# Patient Record
Sex: Female | Born: 1975
Health system: Southern US, Community
[De-identification: ages and names within clinical notes are randomized; demographics above are authoritative.]

## PROBLEM LIST (undated history)

## (undated) DIAGNOSIS — Z8489 Family history of other specified conditions: Secondary | ICD-10-CM

## (undated) DIAGNOSIS — I341 Nonrheumatic mitral (valve) prolapse: Secondary | ICD-10-CM

## (undated) DIAGNOSIS — K219 Gastro-esophageal reflux disease without esophagitis: Secondary | ICD-10-CM

## (undated) DIAGNOSIS — J45909 Unspecified asthma, uncomplicated: Secondary | ICD-10-CM

## (undated) DIAGNOSIS — J189 Pneumonia, unspecified organism: Secondary | ICD-10-CM

## (undated) DIAGNOSIS — IMO0001 Reserved for inherently not codable concepts without codable children: Secondary | ICD-10-CM

## (undated) DIAGNOSIS — K5900 Constipation, unspecified: Secondary | ICD-10-CM

## (undated) DIAGNOSIS — Z8639 Personal history of other endocrine, nutritional and metabolic disease: Secondary | ICD-10-CM

## (undated) DIAGNOSIS — D649 Anemia, unspecified: Secondary | ICD-10-CM

## (undated) DIAGNOSIS — E119 Type 2 diabetes mellitus without complications: Secondary | ICD-10-CM

## (undated) DIAGNOSIS — R011 Cardiac murmur, unspecified: Secondary | ICD-10-CM

## (undated) HISTORY — DX: Unspecified asthma, uncomplicated: J45.909

## (undated) HISTORY — DX: Nonrheumatic mitral (valve) prolapse: I34.1

## (undated) HISTORY — DX: Personal history of other endocrine, nutritional and metabolic disease: Z86.39

## (undated) HISTORY — DX: Anemia, unspecified: D64.9

## (undated) HISTORY — PX: ABDOMINAL HYSTERECTOMY: SHX81

---

## 1997-06-18 ENCOUNTER — Other Ambulatory Visit: Admission: RE | Admit: 1997-06-18 | Discharge: 1997-06-18 | Payer: Self-pay | Admitting: Family Medicine

## 1997-07-26 ENCOUNTER — Other Ambulatory Visit: Admission: RE | Admit: 1997-07-26 | Discharge: 1997-07-26 | Payer: Self-pay | Admitting: Family Medicine

## 2000-01-20 ENCOUNTER — Emergency Department (HOSPITAL_COMMUNITY): Admission: EM | Admit: 2000-01-20 | Discharge: 2000-01-20 | Payer: Self-pay | Admitting: Emergency Medicine

## 2000-03-11 ENCOUNTER — Encounter: Payer: Self-pay | Admitting: Family Medicine

## 2000-03-11 ENCOUNTER — Encounter: Admission: RE | Admit: 2000-03-11 | Discharge: 2000-03-11 | Payer: Self-pay | Admitting: Family Medicine

## 2000-03-15 ENCOUNTER — Encounter: Admission: RE | Admit: 2000-03-15 | Discharge: 2000-03-15 | Payer: Self-pay | Admitting: Family Medicine

## 2000-03-15 ENCOUNTER — Encounter: Payer: Self-pay | Admitting: Family Medicine

## 2002-06-12 ENCOUNTER — Emergency Department (HOSPITAL_COMMUNITY): Admission: EM | Admit: 2002-06-12 | Discharge: 2002-06-13 | Payer: Self-pay | Admitting: Emergency Medicine

## 2002-06-13 ENCOUNTER — Encounter: Payer: Self-pay | Admitting: Emergency Medicine

## 2003-01-26 HISTORY — PX: TUBAL LIGATION: SHX77

## 2003-04-21 ENCOUNTER — Inpatient Hospital Stay (HOSPITAL_COMMUNITY): Admission: AD | Admit: 2003-04-21 | Discharge: 2003-04-23 | Payer: Self-pay | Admitting: Obstetrics and Gynecology

## 2003-04-22 ENCOUNTER — Encounter (INDEPENDENT_AMBULATORY_CARE_PROVIDER_SITE_OTHER): Payer: Self-pay | Admitting: Specialist

## 2004-06-06 ENCOUNTER — Emergency Department (HOSPITAL_COMMUNITY): Admission: EM | Admit: 2004-06-06 | Discharge: 2004-06-07 | Payer: Self-pay | Admitting: Emergency Medicine

## 2009-05-01 ENCOUNTER — Encounter: Admission: RE | Admit: 2009-05-01 | Discharge: 2009-05-01 | Payer: Self-pay | Admitting: Obstetrics and Gynecology

## 2010-02-14 ENCOUNTER — Encounter: Payer: Self-pay | Admitting: Emergency Medicine

## 2010-06-12 NOTE — H&P (Signed)
Christina Parker, Christina Parker                        ACCOUNT NO.:  [id number]   MEDICAL RECORD NO.:  [id number]                   PATIENT TYPE:  INP   LOCATION:  9162                                 [facility]ILITY:  [facility]   PHYSICIAN:  Everett Graff, M.D.                DATE OF BIRTH:  December 05, 1975   DATE OF ADMISSION:  04/21/2003  DATE OF DISCHARGE:                                HISTORY & PHYSICAL   Christina Parker is a 35 year old married black female, gravida 3, para 1-0-1-1,  at 37-2/7 weeks who presents with a gush of clear fluid at midnight. She  reports mild irregular uterine contractions since then with no bleeding, no  headache, no nausea or vomiting, and no visual disturbances. She reports  positive fetal movement. Her pregnancy has been followed by [facility]  OB/GYN MD service and has been remarkable for: (1) conception on the patch;  (2) group B strep negative.  Her prenatal labs were collected on October 24, 2002. Hemoglobin 12.4, hematocrit 37.8, platelet count 325,000. Blood  type AB positive, antibody negative, RPR nonreactive, rubella immune.  Hepatitis C surface antigen negative. HIV nonreactive. Pap smear within  normal limits. Gonorrhea negative. Chlamydia negative. Cystic fibrosis  negative.  Her one-hour Glucola from February 13, 2003, was within normal  limits.  Culture of the vaginal tract for group B strep on April 11, 2003,  were all negative.   HISTORY OF PRESENT PREGNANCY:  She presented for care at [facility] on  October 24, 2002, at [redacted] weeks gestation. She had an ultrasound at 13 weeks  because she was measuring size greater than dates; best [facility] based on that  ultrasound was May 10, 2003.  The second pregnancy ultrasonography at [redacted]  weeks gestation showed girth consistent with previous ultrasound.  At [redacted]  weeks gestation she was complaining of episodes of tachycardia for which she  had a 2-D echocardiogram, which was within normal limits. During the  pregnancy, the patient stated that she desired postpartum bilateral tubal  ligation. The rest of her prenatal care was unremarkable.   OBSTETRIC HISTORY:  She is a gravida 3, para 1-0-1-1. In March of 1996 at 8  weeks she had an SAB with no complications. In July 1997, she had a vaginal  delivery of female infant weighing 6 pounds 10 ounces at [redacted] weeks gestation  after 13 hours of labor. She had an epidural for anesthesia. The infant's  name was Christina Parker.  There were no complications with that pregnancy or birth.   PAST MEDICAL HISTORY:  She has no medication allergies. She reports  conceiving the pregnancy on Ortho-Evra. She reports having had the usual  childhood illnesses. Surgical history is remarkable for wisdom teeth  extraction.   FAMILY MEDICAL HISTORY:  Mother with history of bronchitis. Maternal  grandmother with insulin-dependent diabetes mellitus. Paternal grandfather  with lung cancer.   GENETIC HISTORY:  The patient has  a family history of twins.   SOCIAL HISTORY:  She is married to the father of the baby. His name is  Surveyor, mining.  He is involved and supportive. They are both college educated and  employed full time. They deny any alcohol, tobacco, or illicit drug use with  the pregnancy.   OBJECTIVE DATA:  VITAL SIGNS: Stable. She is afebrile.  HEENT: Grossly within normal limits.  CHEST: Clear to auscultation.  HEART: Regular rate and rhythm.  ABDOMEN: Gravid in contour with fundal height extending approximately 37 cm  above the pubic symphysis.  Fetal heart rate is reactive and reassuring.  Uterine contractions are irregular and mild every six to eight minutes.  Cervix is 1 cm, 50%, vertex, -3, with copious clear fluid present.  EXTREMITIES: Within normal limits.   ASSESSMENT:  1. Intrauterine pregnancy at term.  2. Premature range of motion with no labor.  3. Group B strep negative.   PLAN:  1. Admit to birthing suite and Dr. Mancel Bale has been notified.  2.  Routine MD orders.  3. Patient plans Pitocin induction.     Serita Grammes, C.N.M.                     Everett Graff, M.D.    KS/MEDQ  D:  04/21/2003  T:  04/21/2003  Job:  [id number]

## 2010-06-12 NOTE — Discharge Summary (Signed)
Christina Parker, Christina Parker                        ACCOUNT NO.:  [id number]   MEDICAL RECORD NO.:  [id number]                   PATIENT TYPE:  INP   LOCATION:  9106                                 [facility]   PHYSICIAN:  Eli Hose, M.D.            DATE OF BIRTH:  1975-02-14   DATE OF ADMISSION:  04/21/2003  DATE OF DISCHARGE:                                 DISCHARGE SUMMARY   ADMITTING DIAGNOSES:  1. Intrauterine pregnancy at term.  2. Premature rupture of membranes prior to the onset of labor.  3. Mild mitral valve prolapse.   DISCHARGE DIAGNOSES:  1. Intrauterine pregnancy at term.  2. Desired sterilization.   PROCEDURES:  1. Normal spontaneous vaginal birth.  2. Postpartum bilateral tubal ligation.  3. Epidural anesthesia.   HOSPITAL COURSE:  [name] is a 35 year old gravida 3 para 1-0-1-1 at 19  and two-sevenths weeks who presented with rupture of membranes on the  morning of April 21, 2003.  She was having mild irregular contractions at  that time.  Her pregnancy had been remarkable for:  1. Conception on the birth control patch.  2. Desires tubal sterilization.  3. Mild mitral valve prolapse.   On admission the patient was 1 cm, 50%, vertex at a -3, with clear fluid  noted.  Pitocin was begun after consult with Dr. Mancel Bale and at the  patient's preference.  An epidural was placed.  She was given antibiotics,  ampicillin and gentamycin, for mitral valve prolapse.  She progressed well  to delivery on April 21, 2003 at 1:19 p.m.  She had a viable female by the  name of Kelvin.  She had no lacerations noted.  She had some minor abrasions  on both labia.  The infant was taken to the full-term nursery.  Mother was  taken to the recovery phase in good condition.  She had consented for tubal  sterilization.  She was then taken the next morning to surgery for tubal  sterilization performed by Dr. Kendall Flack.  Hemoglobin that day was  9.4, down from 10.5.   White blood cell count 10.3, platelets were 250.  She  was doing well, she was up ad lib, tolerating a regular diet the day before.  Surgical procedure tubal sterilization was accomplished without difficulty.  Findings were normal tubes.  The patient then was taken to recovery in good  condition.  The rest of her hospital stay was uneventful.  By postpartum day  #2 and postoperative day #1 she was doing well, she was up ad lib, she was  tolerating a regular diet, she was using Motrin for pain, she was  breastfeeding and doing a little supplementation.  The infant was doing  well.  She was therefore deemed to have received the full benefit of her  hospital stay and was discharged home.   DISCHARGE INSTRUCTIONS:  Per [facility] handout.   DISCHARGE MEDICATIONS:  1. Motrin 600 mg p.o. q.6h. p.r.n. pain.  2. Tylox one to two p.o. q.3-4h. p.r.n. pain   DISCHARGE FOLLOW-UP:  Discharge follow-up will occur in 6 weeks at [facility].     Cathlean Marseilles Cira Servant, C.N.M.                   Eli Hose, M.D.    Marnee Spring  D:  04/23/2003  T:  04/23/2003  Job:  [id number]

## 2010-06-12 NOTE — Op Note (Signed)
[name], Christina Parker                        ACCOUNT NO.:  [id number]   MEDICAL RECORD NO.:  [id number]                   PATIENT TYPE:  INP   LOCATION:  9106                                 [facility]   PHYSICIAN:  Eldred Manges, M.D.             DATE OF BIRTH:  [birth year]   DATE OF PROCEDURE:  04/22/2003  DATE OF DISCHARGE:                                 OPERATIVE REPORT   PREOPERATIVE DIAGNOSIS:  Desire for surgical sterilization.   POSTOPERATIVE DIAGNOSIS:  Desire for surgical sterilization.   OPERATION:  Postpartum tubal sterilization.   SURGEON:  Eldred Manges, M.D.   ANESTHESIA:  Epidural.   ESTIMATED BLOOD LOSS:  Less than 10 mL.   COMPLICATIONS:  None.   FINDINGS:  Tubes appear normal for postpartum state.   DESCRIPTION OF PROCEDURE:  A discussion was held with the patient concerning  the indications for her procedure as well as the risks involved.  The  indication is her desire for her surgical sterilization.  The risks involved  include, but are not limited to, anesthesia, bleeding, infection, damage to  adjacent organs and failure of tubal sterilization resulting in subsequent  pregnancy, some of which are ectopic.  The patient seemed to understand and  wished to proceed.  She was taken to the operating room where her labor  epidural was dosed for surgical anesthesia.  She was placed on the  operating table in the supine position and the abdomen and perineum were  prepped with multiple layers of Betadine.  A red Robinson catheter was used  to empty the bladder.  The abdomen was draped as a sterile field.  Subumbilical injection of 5 mL of 0.25% Marcaine was undertaken.  A  subumbilical incision was made and the abdomen opened in layers to enter the  peritoneum.  The left fallopian tube was identified, followed to its  fimbriated end, then grasped at the isthmic portion and elevated.  A suture  of 2-0 chromic was placed through the mesosalpinx and  tied fore and aft on  the knuckle of tube.  A second ligature was placed proximal to that.  The  intervening knuckle of tube was excised and the cut ends cauterized.  A  similar procedure was carried out on the opposite side.  The hemostasis was  noted to be adequate.  The abdominal peritoneum was closed with a  pursestring suture of 0 Vicryl.  The fascia was closed with a running suture  of 0 Vicryl.  The skin incision was closed with a subcuticular suture of 3-0  Vicryl.  A sterile dressing was applied and the patient was taken from the  operating room to the recovery room in satisfactory condition having  tolerated the procedure well with sponge and instrument counts correct.   SPECIMENS TO PATHOLOGY:  Portions of right and left fallopian tube.  Eldred Manges, M.D.   VPH/MEDQ  D:  04/22/2003  T:  04/22/2003  Job:  [id number]

## 2013-03-30 ENCOUNTER — Ambulatory Visit (INDEPENDENT_AMBULATORY_CARE_PROVIDER_SITE_OTHER): Payer: Federal, State, Local not specified - PPO | Admitting: Family Medicine

## 2013-03-30 ENCOUNTER — Encounter: Payer: Self-pay | Admitting: Family Medicine

## 2013-03-30 VITALS — BP 110/80 | HR 78 | Temp 98.4°F | Resp 16 | Ht 63.0 in | Wt 216.0 lb

## 2013-03-30 DIAGNOSIS — Z0000 Encounter for general adult medical examination without abnormal findings: Secondary | ICD-10-CM

## 2013-03-30 DIAGNOSIS — Z131 Encounter for screening for diabetes mellitus: Secondary | ICD-10-CM

## 2013-03-30 DIAGNOSIS — Z13220 Encounter for screening for lipoid disorders: Secondary | ICD-10-CM

## 2013-03-30 LAB — LIPID PANEL
Cholesterol: 145 mg/dL (ref 0–200)
HDL: 37 mg/dL — ABNORMAL LOW (ref >39)
LDL Cholesterol: 94 mg/dL (ref 0–99)
Total CHOL/HDL Ratio: 3.9 ratio
Triglycerides: 70 mg/dL (ref <150)
VLDL: 14 mg/dL (ref 0–40)

## 2013-03-30 LAB — CBC
HCT: 37.5 % (ref 36.0–46.0)
Hemoglobin: 12.3 g/dL (ref 12.0–15.0)
MCH: 25.5 pg — ABNORMAL LOW (ref 26.0–34.0)
MCHC: 32.8 g/dL (ref 30.0–36.0)
MCV: 77.8 fL — ABNORMAL LOW (ref 78.0–100.0)
Platelets: 361 10*3/uL (ref 150–400)
RBC: 4.82 MIL/uL (ref 3.87–5.11)
RDW: 15.6 % — ABNORMAL HIGH (ref 11.5–15.5)
WBC: 6.6 10*3/uL (ref 4.0–10.5)

## 2013-03-30 LAB — COMPREHENSIVE METABOLIC PANEL WITH GFR
CO2: 24 meq/L (ref 19–32)
Calcium: 9.1 mg/dL (ref 8.4–10.5)
Creat: 0.72 mg/dL (ref 0.50–1.10)
Total Bilirubin: 0.4 mg/dL (ref 0.2–1.2)

## 2013-03-30 LAB — COMPREHENSIVE METABOLIC PANEL
ALT: 14 U/L (ref 0–35)
AST: 14 U/L (ref 0–37)
Albumin: 3.7 g/dL (ref 3.5–5.2)
Alkaline Phosphatase: 68 U/L (ref 39–117)
BUN: 12 mg/dL (ref 6–23)
Chloride: 106 mEq/L (ref 96–112)
Glucose, Bld: 95 mg/dL (ref 70–99)
Potassium: 4 mEq/L (ref 3.5–5.3)
Sodium: 141 mEq/L (ref 135–145)
Total Protein: 7.3 g/dL (ref 6.0–8.3)

## 2013-03-30 LAB — TSH: TSH: 1.327 u[IU]/mL (ref 0.350–4.500)

## 2013-03-30 LAB — POCT GLYCOSYLATED HEMOGLOBIN (HGB A1C): Hemoglobin A1C: 5.4

## 2013-03-30 LAB — VITAMIN B12: Vitamin B-12: 514 pg/mL (ref 211–911)

## 2013-03-30 NOTE — Progress Notes (Deleted)
   Subjective:    Patient ID: Christina Parker, female    DOB: 05/26/1975, 38 y.o.   MRN: [id number]  HPI    Review of Systems     Objective:   Physical Exam        Assessment & Plan:

## 2013-03-30 NOTE — Progress Notes (Signed)
Chief Complaint:  Chief Complaint  Patient presents with  . Annual Exam    HPI: Christina Parker is a 38 y.o. female who is here for  Annual Visit She is doing well, she has heavy cycles and will get advice from Dr Marlane Hatcher she sees her next She is seeing Dr Orvan Seen for her ob/gyn care. She is going to see her next week, on her menses right now, she  She is irregular, 6 months without menses Skipped Nov, Dec January, has had it in Feb LAsts 5 days, heavy, ful of clots LMP on Monday [id number] 35 and 38 year old childre Prior pap was years ago Menarche 11  Prior breast  Last pap was many years ago, no prior abnormal pap MVP dx some years ago, [facility]    Past Medical History  Diagnosis Date  . Anemia   . MVP (mitral valve prolapse)   . History of vitamin B deficiency   . History of vitamin D deficiency    History reviewed. No pertinent past surgical history. History   Social History  . Marital Status: Married    Spouse Name: N/A    Number of Children: N/A  . Years of Education: N/A   Social History Main Topics  . Smoking status: Never Smoker   . Smokeless tobacco: None  . Alcohol Use: No  . Drug Use: No  . Sexual Activity: None   Other Topics Concern  . None   Social History Narrative   Married   Manufacturing systems engineer   Family History  Problem Relation Age of Onset  . Diabetes Sister   . Diabetes Maternal Grandmother   . Hypertension Maternal Grandmother   . Hypertension Maternal Grandfather   . Diabetes Paternal Grandmother   . Hypertension Paternal Grandmother   . Diabetes Paternal Grandfather   . Hypertension Paternal Grandfather    No Known Allergies Prior to Admission medications   Not on File     ROS: The patient denies fevers, chills, night sweats, unintentional weight loss, chest pain, palpitations, wheezing, dyspnea on exertion, nausea, vomiting, abdominal pain, dysuria, hematuria, melena, numbness,  weakness, or tingling.   All other systems have been reviewed and were otherwise negative with the exception of those mentioned in the HPI and as above.    PHYSICAL EXAM: Filed Vitals:   03/30/13 0812  BP: 110/80  Pulse: 78  Temp: 98.4 F (36.9 C)  Resp: 16   Filed Vitals:   03/30/13 0812  Height: 5' 3"  (1.6 m)  Weight: 216 lb (97.977 kg)   Body mass index is 38.27 kg/(m^2).  General: Alert, no acute distress, pleasant HEENT:  Normocephalic, atraumatic, oropharynx patent. EOMI, PERRLA, no thyroidmegaly Cardiovascular:  Regular rate and rhythm, no rubs murmurs or gallops.  No Carotid bruits, radial pulse intact. No pedal edema.  Respiratory: Clear to auscultation bilaterally.  No wheezes, rales, or rhonchi.  No cyanosis, no use of accessory musculature GI: No organomegaly, abdomen is soft and non-tender, positive bowel sounds.  No masses. Skin: No rashes. Neurologic: Facial musculature symmetric. Psychiatric: Patient is appropriate throughout our interaction. Lymphatic: No cervical lymphadenopathy Musculoskeletal: Gait intact. 5/5 strength, 2/2 DTRs, full ROM   LABS: No results found for this or any previous visit.   EKG/XRAY:   Primary read interpreted by Dr. Marin Comment at [facility].   ASSESSMENT/PLAN: Encounter Diagnoses  Name Primary?  . Annual physical exam Yes  . Screening for  diabetes mellitus   . Screening for hyperlipidemia    Defer pelvic and pap to ob/gyn Labs pending She will sign a release to get [facility] for [facility] for [facility] Did not hear it onher today, she ahd this during pregnancy F/u in 1 year or prn  Gross sideeffects, risk and benefits, and alternatives of medications d/w patient. Patient is aware that all medications have potential sideeffects and we are unable to predict every sideeffect or drug-drug interaction that may occur.  LE, [place], DO 03/30/2013 8:45 AM

## 2013-03-31 LAB — VITAMIN D 25 HYDROXY (VIT D DEFICIENCY, FRACTURES): Vit D, 25-Hydroxy: 45 ng/mL (ref 30–89)

## 2013-04-13 ENCOUNTER — Encounter: Payer: Self-pay | Admitting: Family Medicine

## 2013-04-19 ENCOUNTER — Telehealth: Payer: Self-pay

## 2013-04-19 NOTE — Telephone Encounter (Signed)
Spoke to pt,  given lab values from 03/30/13.  A1c, hgb, hct

## 2013-04-19 NOTE — Telephone Encounter (Signed)
Lab call   [phone]  Work cell phone [phone]

## 2013-07-24 ENCOUNTER — Encounter (HOSPITAL_COMMUNITY): Payer: Self-pay | Admitting: *Deleted

## 2013-07-27 ENCOUNTER — Encounter (HOSPITAL_COMMUNITY): Payer: Self-pay | Admitting: Pharmacist

## 2013-08-01 MED ORDER — DEXTROSE 5 % IV SOLN
2.0000 g | INTRAVENOUS | Status: AC
  Administered 2013-08-02: 2 g via INTRAVENOUS
  Filled 2013-08-01: qty 2

## 2013-08-01 NOTE — H&P (Addendum)
38 yo G2P2 with menorrhagia, dysmenorrhea and irregular menses presents for surgical mngt.  Pt unable to tolerate OCPs because of severe nausea.   PMHx:  Neg PShx:;SVD x 2, BTL Shx:  Negative All:  None Meds:  Vitamins FHx:  N/c  AF, VSS Gen - NAD CV - RRR Lungs - clear Abd - soft, NT PV - uterus mobile, NT.  No adnexal tenderness/masses Unable to perform in office EmBx b/c of stenosis of internal cervical os  [place]:  Normal uterus without masses.  EMS 3.106m.  No adnexal masses. No  Free fluid  A/P:  Menorrhagia/Dysmenorrhea Hysteroscopy, D&C, endometrial ablation R/b/a & expected outcome discussed, questions answered, informed consent

## 2013-08-02 ENCOUNTER — Ambulatory Visit (HOSPITAL_COMMUNITY)
Admission: RE | Admit: 2013-08-02 | Discharge: 2013-08-02 | Disposition: A | Discharge status: Home / Self Care | Payer: Federal, State, Local not specified - PPO | Source: Ambulatory Visit | Attending: Obstetrics and Gynecology | Admitting: Obstetrics and Gynecology

## 2013-08-02 ENCOUNTER — Encounter (HOSPITAL_COMMUNITY): Payer: Federal, State, Local not specified - PPO | Admitting: Anesthesiology

## 2013-08-02 ENCOUNTER — Ambulatory Visit (HOSPITAL_COMMUNITY): Payer: Federal, State, Local not specified - PPO | Admitting: Anesthesiology

## 2013-08-02 ENCOUNTER — Encounter (HOSPITAL_COMMUNITY)
Admission: RE | Disposition: A | Discharge status: Home / Self Care | Payer: Self-pay | Source: Ambulatory Visit | Attending: Obstetrics and Gynecology

## 2013-08-02 ENCOUNTER — Encounter (HOSPITAL_COMMUNITY): Payer: Self-pay | Admitting: *Deleted

## 2013-08-02 DIAGNOSIS — D649 Anemia, unspecified: Secondary | ICD-10-CM | POA: Insufficient documentation

## 2013-08-02 DIAGNOSIS — N920 Excessive and frequent menstruation with regular cycle: Secondary | ICD-10-CM | POA: Insufficient documentation

## 2013-08-02 DIAGNOSIS — N926 Irregular menstruation, unspecified: Secondary | ICD-10-CM | POA: Insufficient documentation

## 2013-08-02 DIAGNOSIS — N946 Dysmenorrhea, unspecified: Secondary | ICD-10-CM | POA: Insufficient documentation

## 2013-08-02 DIAGNOSIS — I059 Rheumatic mitral valve disease, unspecified: Secondary | ICD-10-CM | POA: Insufficient documentation

## 2013-08-02 DIAGNOSIS — R011 Cardiac murmur, unspecified: Secondary | ICD-10-CM | POA: Insufficient documentation

## 2013-08-02 HISTORY — DX: Family history of other specified conditions: Z84.89

## 2013-08-02 HISTORY — PX: DILITATION & CURRETTAGE/HYSTROSCOPY WITH NOVASURE ABLATION: SHX5568

## 2013-08-02 LAB — CBC
HEMATOCRIT: 36.4 % (ref 36.0–46.0)
HEMOGLOBIN: 11.9 g/dL — AB (ref 12.0–15.0)
MCH: 26.2 pg (ref 26.0–34.0)
MCHC: 32.7 g/dL (ref 30.0–36.0)
MCV: 80 fL (ref 78.0–100.0)
Platelets: 302 10*3/uL (ref 150–400)
RBC: 4.55 MIL/uL (ref 3.87–5.11)
RDW: 15.3 % (ref 11.5–15.5)
WBC: 7.7 10*3/uL (ref 4.0–10.5)

## 2013-08-02 SURGERY — DILATATION & CURETTAGE/HYSTEROSCOPY WITH NOVASURE ABLATION
Anesthesia: General | Site: Vagina

## 2013-08-02 MED ORDER — OXYCODONE-ACETAMINOPHEN 5-325 MG PO TABS: 1.0000 | ORAL_TABLET | ORAL | Status: DC | PRN

## 2013-08-02 MED ORDER — ONDANSETRON HCL 4 MG/2ML IJ SOLN
INTRAMUSCULAR | Status: AC
  Filled 2013-08-02: qty 2

## 2013-08-02 MED ORDER — FENTANYL CITRATE 0.05 MG/ML IJ SOLN
INTRAMUSCULAR | Status: AC
  Administered 2013-08-02: 50 ug via INTRAVENOUS
  Filled 2013-08-02: qty 2

## 2013-08-02 MED ORDER — PROPOFOL 10 MG/ML IV EMUL
INTRAVENOUS | Status: AC
  Filled 2013-08-02: qty 20

## 2013-08-02 MED ORDER — MIDAZOLAM HCL 5 MG/5ML IJ SOLN
INTRAMUSCULAR | Status: DC | PRN
  Administered 2013-08-02: 2 mg via INTRAVENOUS

## 2013-08-02 MED ORDER — KETOROLAC TROMETHAMINE 30 MG/ML IJ SOLN: 15.0000 mg | Freq: Once | INTRAMUSCULAR | Status: DC | PRN

## 2013-08-02 MED ORDER — DEXAMETHASONE SODIUM PHOSPHATE 10 MG/ML IJ SOLN
INTRAMUSCULAR | Status: DC | PRN
  Administered 2013-08-02: 10 mg via INTRAVENOUS

## 2013-08-02 MED ORDER — LIDOCAINE HCL (CARDIAC) 20 MG/ML IV SOLN
INTRAVENOUS | Status: DC | PRN
  Administered 2013-08-02: 60 mg via INTRAVENOUS

## 2013-08-02 MED ORDER — PROPOFOL 10 MG/ML IV BOLUS
INTRAVENOUS | Status: DC | PRN
  Administered 2013-08-02: 200 mg via INTRAVENOUS

## 2013-08-02 MED ORDER — FENTANYL CITRATE 0.05 MG/ML IJ SOLN
25.0000 ug | INTRAMUSCULAR | Status: DC | PRN
  Administered 2013-08-02 (×2): 50 ug via INTRAVENOUS

## 2013-08-02 MED ORDER — METOCLOPRAMIDE HCL 5 MG/ML IJ SOLN
INTRAMUSCULAR | Status: AC
  Filled 2013-08-02: qty 2

## 2013-08-02 MED ORDER — IBUPROFEN 600 MG PO TABS: 600.0000 mg | ORAL_TABLET | Freq: Four times a day (QID) | ORAL | Status: DC | PRN

## 2013-08-02 MED ORDER — KETOROLAC TROMETHAMINE 30 MG/ML IJ SOLN
INTRAMUSCULAR | Status: AC
  Filled 2013-08-02: qty 1

## 2013-08-02 MED ORDER — MIDAZOLAM HCL 2 MG/2ML IJ SOLN
INTRAMUSCULAR | Status: AC
  Filled 2013-08-02: qty 2

## 2013-08-02 MED ORDER — DEXAMETHASONE SODIUM PHOSPHATE 10 MG/ML IJ SOLN
INTRAMUSCULAR | Status: AC
Start: 2013-08-02 — End: 2013-08-02
  Filled 2013-08-02: qty 1

## 2013-08-02 MED ORDER — METOCLOPRAMIDE HCL 5 MG/ML IJ SOLN
INTRAMUSCULAR | Status: AC
  Administered 2013-08-02: 10 mg via INTRAVENOUS
  Filled 2013-08-02: qty 2

## 2013-08-02 MED ORDER — METOCLOPRAMIDE HCL 5 MG/ML IJ SOLN
10.0000 mg | Freq: Once | INTRAMUSCULAR | Status: AC | PRN
  Administered 2013-08-02: 10 mg via INTRAVENOUS

## 2013-08-02 MED ORDER — LIDOCAINE HCL (CARDIAC) 20 MG/ML IV SOLN
INTRAVENOUS | Status: AC
  Filled 2013-08-02: qty 5

## 2013-08-02 MED ORDER — LIDOCAINE HCL 1 % IJ SOLN
INTRAMUSCULAR | Status: AC
  Filled 2013-08-02: qty 20

## 2013-08-02 MED ORDER — ONDANSETRON HCL 4 MG/2ML IJ SOLN
INTRAMUSCULAR | Status: DC | PRN
  Administered 2013-08-02: 4 mg via INTRAVENOUS

## 2013-08-02 MED ORDER — LIDOCAINE HCL 1 % IJ SOLN
INTRAMUSCULAR | Status: DC | PRN
  Administered 2013-08-02: 10 mL

## 2013-08-02 MED ORDER — FENTANYL CITRATE 0.05 MG/ML IJ SOLN
INTRAMUSCULAR | Status: AC
  Filled 2013-08-02: qty 2

## 2013-08-02 MED ORDER — MEPERIDINE HCL 25 MG/ML IJ SOLN: 6.2500 mg | INTRAMUSCULAR | Status: DC | PRN

## 2013-08-02 MED ORDER — LACTATED RINGERS IV SOLN
INTRAVENOUS | Status: DC
  Administered 2013-08-02 (×2): via INTRAVENOUS

## 2013-08-02 MED ORDER — FENTANYL CITRATE 0.05 MG/ML IJ SOLN
INTRAMUSCULAR | Status: DC | PRN
  Administered 2013-08-02 (×2): 50 ug via INTRAVENOUS

## 2013-08-02 MED ORDER — KETOROLAC TROMETHAMINE 30 MG/ML IJ SOLN
INTRAMUSCULAR | Status: DC | PRN
  Administered 2013-08-02: 30 mg via INTRAVENOUS

## 2013-08-02 SURGICAL SUPPLY — 16 items
ABLATOR ENDOMETRIAL BIPOLAR (ABLATOR) ×2 IMPLANT
CATH ROBINSON RED A/P 16FR (CATHETERS) ×2 IMPLANT
CLOTH BEACON ORANGE TIMEOUT ST (SAFETY) ×2 IMPLANT
CONTAINER PREFILL 10% NBF 60ML (FORM) ×1 IMPLANT
DILATOR CANAL MILEX (MISCELLANEOUS) ×1 IMPLANT
DRAPE HYSTEROSCOPY (DRAPE) ×2 IMPLANT
GLOVE BIO SURGEON STRL SZ 6.5 (GLOVE) ×2 IMPLANT
GLOVE BIOGEL PI IND STRL 7.0 (GLOVE) ×1 IMPLANT
GLOVE BIOGEL PI INDICATOR 7.0 (GLOVE) ×1
GOWN STRL REUS W/TWL LRG LVL3 (GOWN DISPOSABLE) ×4 IMPLANT
PACK VAGINAL MINOR WOMEN LF (CUSTOM PROCEDURE TRAY) ×2 IMPLANT
PAD OB MATERNITY 4.3X12.25 (PERSONAL CARE ITEMS) ×2 IMPLANT
SET TUBING HYSTEROSCOPY 2 NDL (TUBING) ×1 IMPLANT
TOWEL OR 17X24 6PK STRL BLUE (TOWEL DISPOSABLE) ×4 IMPLANT
TUBE HYSTEROSCOPY W Y-CONNECT (TUBING) ×1 IMPLANT
WATER STERILE IRR 1000ML POUR (IV SOLUTION) ×2 IMPLANT

## 2013-08-02 NOTE — Op Note (Signed)
Christina Parker, Christina Parker              ACCOUNT NO.:  [id number]  MEDICAL RECORD NO.:  [id number]  LOCATION:  [facility]                          FACILITY:  [facility]  PHYSICIAN:  Marylynn Pearson, MD    DATE OF BIRTH:  07-Mar-1975  DATE OF PROCEDURE:  08/02/2013 DATE OF DISCHARGE:                              OPERATIVE REPORT   PREOPERATIVE DIAGNOSES: 1. Menorrhagia. 2. Dysmenorrhea.  POSTOPERATIVE DIAGNOSES: 1. Menorrhagia. 2. Dysmenorrhea.  PROCEDURE: 1. Paracervical block. 2. Hysteroscopy. 3. Dilation and curettage. 4. NovaSure endometrial ablation.  SURGEON:  Marylynn Pearson, MD  ANESTHESIA:  General.  SPECIMEN:  Endometrial curettings.  COMPLICATIONS:  None.  CONDITION:  Stable to recovery room.  DESCRIPTION OF PROCEDURE:  The patient was taken to the operating room after informed consent was obtained, she was given general anesthesia, placed in the dorsal lithotomy position using Allen stirrups, prepped and draped in sterile fashion.  An in and out catheter was used to drain her bladder for an unmeasured amounts of urine. Bivalve speculum was placed in the vagina and 1 mL of 1% lidocaine was placed at the anterior lip of the cervix.  Single-tooth tenaculum was attached to the cervix and a paracervical block was performed.  The cervix was then serially dilated using Pratt dilators.  The uterus was sounded to 10 cm. Cervical length was 5.  Diagnostic hysteroscope was inserted and a survey was performed.  Bilateral ostia were visualized and appeared normal.  No masses or abnormalities were noted.  Hysteroscope was removed and a gentle curetting was performed.  Specimen was placed on Telfa and passed off to be sent to Pathology.  NovaSure device was then inserted and an ablation was performed using standard guidelines.  Rep was present in the room.  Once the cycle was complete, the NovaSure device was removed from the uterus.  Tenaculum was removed.  The cervix was hemostatic.   Speculum was removed.  The patient was extubated and taken to the recovery room in stable condition.  Sponge, lap, needle, and instrument counts were correct x2.     Marylynn Pearson, MD     GA/MEDQ  D:  08/02/2013  T:  08/02/2013  Job:  [id number]

## 2013-08-02 NOTE — Anesthesia Postprocedure Evaluation (Signed)
Anesthesia Post Note  Patient: Christina Parker  Procedure(s) Performed: Procedure(s) (LRB): DILATATION & CURETTAGE/HYSTEROSCOPY WITH NOVASURE ABLATION (N/A)  Anesthesia type: General  Patient location: PACU  Post pain: Pain level controlled  Post assessment: Post-op Vital signs reviewed  Last Vitals:  Filed Vitals:   08/02/13 0830  BP: 119/69  Pulse: 73  Temp:   Resp: 16    Post vital signs: Reviewed  Level of consciousness: sedated  Complications: No apparent anesthesia complications

## 2013-08-02 NOTE — Transfer of Care (Signed)
Immediate Anesthesia Transfer of Care Note  Patient: Christina Parker  Procedure(s) Performed: Procedure(s): DILATATION & CURETTAGE/HYSTEROSCOPY WITH NOVASURE ABLATION (N/A)  Patient Location: PACU  Anesthesia Type:General  Level of Consciousness: sedated  Airway & Oxygen Therapy: Patient Spontanous Breathing and Patient connected to nasal cannula oxygen  Post-op Assessment: Report given to PACU RN and Post -op Vital signs reviewed and stable  Post vital signs: stable  Complications: No apparent anesthesia complications

## 2013-08-02 NOTE — Discharge Instructions (Signed)
FU office 2-3 weeks for postop appointment.  Call the office [phone] for an appointment.  MAY TAKE IBUPROFEN (MOTRIN, ADVIL) OR ALEVE AFTER 2:00 PM FOR CRAMPS!!  Personal Hygiene: Use pads not tampons x 1week You may shower, no tub baths or pools for 2-3 weeks Wipe from front to back when using restroom  Activity: Do not drive or operate any equipment for 24 hrs.   Do not rest in bed all day Walking is encouraged Walk up and down stairs slowly You may return to your normal activity in 1-2 days  Sexual Activity:  No intercourse for 2 weeks after the procedure.  Diet: Eat a light meal as desired this evening.  You may resume your usual diet tomorrow.  Return to work:  You may resume your work activities after 1-2 days  What to expect:  Expect to have vaginal bleeding/discharge for 2-3 days and spotting for 10-14 days.  It is not unusual to have soreness for 1-2 weeks.  You may have a slight burning sensation when you urinate for the first few days.  You may start your menses in 2-6 weeks.  Mild cramps may continue for a couple of days.    Call your doctor:   Excessive bleeding, saturating a pad every hour Inability to urinate 6 hours after discharge Pain not relieved with pain medications Fever of 100.4 or greater

## 2013-08-02 NOTE — Anesthesia Preprocedure Evaluation (Addendum)
Anesthesia Evaluation  Patient identified by MRN, date of birth, ID band Patient awake    Reviewed: Allergy & Precautions, H&P , NPO status , Patient's Chart, lab work & pertinent test results, reviewed documented beta blocker date and time   History of Anesthesia Complications Negative for: history of anesthetic complications  Airway Mallampati: III TM Distance: >3 FB Neck ROM: full    Dental  (+) Teeth Intact   Pulmonary neg pulmonary ROS,  breath sounds clear to auscultation  Pulmonary exam normal       Cardiovascular + Valvular Problems/Murmurs MVP Rhythm:regular Rate:Normal     Neuro/Psych negative neurological ROS  negative psych ROS   GI/Hepatic negative GI ROS, Neg liver ROS,   Endo/Other  BMI 37.8  Renal/GU   Female GU complaint     Musculoskeletal   Abdominal   Peds  Hematology negative hematology ROS (+) anemia ,   Anesthesia Other Findings   Reproductive/Obstetrics negative OB ROS                         Anesthesia Physical Anesthesia Plan  ASA: II  Anesthesia Plan: General LMA   Post-op Pain Management:    Induction:   Airway Management Planned:   Additional Equipment:   Intra-op Plan:   Post-operative Plan:   Informed Consent: I have reviewed the patients History and Physical, chart, labs and discussed the procedure including the risks, benefits and alternatives for the proposed anesthesia with the patient or authorized representative who has indicated his/her understanding and acceptance.   Dental Advisory Given  Plan Discussed with: CRNA and Surgeon  Anesthesia Plan Comments:         Anesthesia Quick Evaluation

## 2013-08-03 ENCOUNTER — Encounter (HOSPITAL_COMMUNITY): Payer: Self-pay | Admitting: Obstetrics and Gynecology

## 2013-11-13 ENCOUNTER — Ambulatory Visit (INDEPENDENT_AMBULATORY_CARE_PROVIDER_SITE_OTHER): Payer: Federal, State, Local not specified - PPO | Admitting: Physician Assistant

## 2013-11-13 DIAGNOSIS — Z111 Encounter for screening for respiratory tuberculosis: Secondary | ICD-10-CM

## 2013-11-13 NOTE — Progress Notes (Signed)
Subjective:  Mrs. Banbury is a 38 y.o. female presenting for PPD placement for her continued employment. TB risk questionnaire was completed by [place].  Patient denies cough, hemoptysis, fever, chills, night sweats, weight loss.  Objective: There were no vitals taken for this visit.  PPD placed.  A/P:  Plan for follow up in ~72 hours. 1. Screening-pulmonary TB - TB Skin Test   Jaynee Eagles, PA-C [facility] [phone] 11/13/2013 1:06 PM

## 2013-11-13 NOTE — Progress Notes (Signed)
.   Tuberculosis Risk Questionnaire  1. No Were you born outside the [place] in one of the following parts of the world: [place], [place], [place], [place] or [place]?    2. No Have you traveled outside the [place] and lived for more than one month in one of the following parts of the world: [place], [place], [place], [place] or [place]?    3. No Do you have a compromised immune system such as from any of the following conditions:HIV/AIDS, organ or bone marrow transplantation, diabetes, immunosuppressive medicines (e.g. Prednisone, Remicaide), leukemia, lymphoma, cancer of the head or neck, gastrectomy or jejunal bypass, end-stage renal disease (on dialysis), or silicosis?     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    5. No Have you ever: injected illegal drugs, used crack cocaine, lived in a homeless shelter  or been in jail or prison?     6. No Have you ever been exposed to anyone with infectious tuberculosis?    Tuberculosis Symptom Questionnaire  Do you currently have any of the following symptoms?  1. No Unexplained cough lasting more than 3 weeks?   2. No Unexplained fever lasting more than 3 weeks.   3. No Night Sweats (sweating that leaves the bedclothes and sheets wet)     4. No Shortness of Breath   5. No Chest Pain   6. No Unintentional weight loss    7. No Unexplained fatigue (very tired for no reason)

## 2013-11-16 ENCOUNTER — Ambulatory Visit (INDEPENDENT_AMBULATORY_CARE_PROVIDER_SITE_OTHER): Payer: Federal, State, Local not specified - PPO | Admitting: *Deleted

## 2013-11-16 DIAGNOSIS — Z7689 Persons encountering health services in other specified circumstances: Secondary | ICD-10-CM

## 2013-11-16 DIAGNOSIS — Z111 Encounter for screening for respiratory tuberculosis: Secondary | ICD-10-CM

## 2013-11-16 LAB — TB SKIN TEST
Induration: 0 mm
TB SKIN TEST: NEGATIVE

## 2013-12-07 ENCOUNTER — Ambulatory Visit (INDEPENDENT_AMBULATORY_CARE_PROVIDER_SITE_OTHER): Payer: Federal, State, Local not specified - PPO | Admitting: Emergency Medicine

## 2013-12-07 VITALS — BP 106/78 | HR 98 | Temp 99.4°F | Resp 18 | Ht 63.0 in | Wt 229.0 lb

## 2013-12-07 DIAGNOSIS — J4521 Mild intermittent asthma with (acute) exacerbation: Secondary | ICD-10-CM

## 2013-12-07 DIAGNOSIS — J45909 Unspecified asthma, uncomplicated: Secondary | ICD-10-CM | POA: Insufficient documentation

## 2013-12-07 DIAGNOSIS — J209 Acute bronchitis, unspecified: Secondary | ICD-10-CM

## 2013-12-07 MED ORDER — ALBUTEROL SULFATE (2.5 MG/3ML) 0.083% IN NEBU
5.0000 mg | INHALATION_SOLUTION | Freq: Once | RESPIRATORY_TRACT | Status: AC
Start: 2013-12-07 — End: 2013-12-07
  Administered 2013-12-07: 5 mg via RESPIRATORY_TRACT

## 2013-12-07 MED ORDER — PROMETHAZINE-CODEINE 6.25-10 MG/5ML PO SYRP: 5.0000 mL | ORAL_SOLUTION | ORAL | Status: DC | PRN

## 2013-12-07 MED ORDER — IPRATROPIUM BROMIDE 0.02 % IN SOLN
0.5000 mg | Freq: Once | RESPIRATORY_TRACT | Status: AC
  Administered 2013-12-07: 0.5 mg via RESPIRATORY_TRACT

## 2013-12-07 MED ORDER — ALBUTEROL SULFATE HFA 108 (90 BASE) MCG/ACT IN AERS: 2.0000 | INHALATION_SPRAY | RESPIRATORY_TRACT | Status: AC | PRN

## 2013-12-07 MED ORDER — AZITHROMYCIN 250 MG PO TABS: ORAL_TABLET | ORAL | Status: DC

## 2013-12-07 NOTE — Patient Instructions (Signed)
Metered Dose Inhaler (No Spacer Used) Inhaled medicines are the basis of treatment for asthma and other breathing problems. Inhaled medicine can only be effective if used properly. Good technique assures that the medicine reaches the lungs. Metered dose inhalers (MDIs) are used to deliver a variety of inhaled medicines. These include quick relief or rescue medicines (such as bronchodilators) and controller medicines (such as corticosteroids). The medicine is delivered by pushing down on a metal canister to release a set amount of spray. If you are using different kinds of inhalers, use your quick relief medicine to open the airways 10-15 minutes before using a steroid, if instructed to do so by your health care provider. If you are unsure which inhalers to use and the order of using them, ask your health care provider, nurse, or respiratory therapist. HOW TO USE THE INHALER 1. Remove the cap from the inhaler. 2. If you are using the inhaler for the first time, you will need to prime it. Shake the inhaler for 5 seconds and release four puffs into the air, away from your face. Ask your health care provider or pharmacist if you have questions about priming your inhaler. 3. Shake the inhaler for 5 seconds before each breath in (inhalation). 4. Position the inhaler so that the top of the canister faces up. 5. Put your index finger on the top of the medicine canister. Your thumb supports the bottom of the inhaler. 6. Open your mouth. 7. Either place the inhaler between your teeth and place your lips tightly around the mouthpiece, or hold the inhaler 1-2 inches away from your open mouth. If you are unsure of which technique to use, ask your health care provider. 8. Breathe out (exhale) normally and as completely as possible. 9. Press the canister down with the index finger to release the medicine. 10. At the same time as the canister is pressed, inhale deeply and slowly until your lungs are completely filled.  This should take 4-6 seconds. Keep your tongue down. 11. Hold the medicine in your lungs for 5-10 seconds (10 seconds is best). This helps the medicine get into the small airways of your lungs. 12. Breathe out slowly, through pursed lips. Whistling is an example of pursed lips. 13. Wait at least 1 minute between puffs. Continue with the above steps until you have taken the number of puffs your health care provider has ordered. Do not use the inhaler more than your health care provider directs you to. 14. Replace the cap on the inhaler. 15. Follow the directions from your health care provider or the inhaler insert for cleaning the inhaler. If you are using a steroid inhaler, after your last puff, rinse your mouth with water, gargle, and spit out the water. Do not swallow the water. AVOID:  Inhaling before or after starting the spray of medicine. It takes practice to coordinate your breathing with triggering the spray.  Inhaling through the nose (rather than the mouth) when triggering the spray. HOW TO DETERMINE IF YOUR INHALER IS FULL OR NEARLY EMPTY You cannot know when an inhaler is empty by shaking it. Some inhalers are now being made with dose counters. Ask your health care provider for a prescription that has a dose counter if you feel you need that extra help. If your inhaler does not have a counter, ask your health care provider to help you determine the date you need to refill your inhaler. Write the refill date on a calendar or your inhaler canister. Refill  your inhaler 7-10 days before it runs out. Be sure to keep an adequate supply of medicine. This includes making sure it has not expired, and making sure you have a spare inhaler. SEEK MEDICAL CARE IF:  Symptoms are only partially relieved with your inhaler.  You are having trouble using your inhaler.  You experience an increase in phlegm. SEEK IMMEDIATE MEDICAL CARE IF:  You feel little or no relief with your inhalers. You are still  wheezing and feeling shortness of breath, tightness in your chest, or both.  You have dizziness, headaches, or a fast heart rate.  You have chills, fever, or night sweats.  There is a noticeable increase in phlegm production, or there is blood in the phlegm. MAKE SURE YOU:  Understand these instructions.  Will watch your condition.  Will get help right away if you are not doing well or get worse. Document Released: 11/08/2006 Document Revised: 05/28/2013 Document Reviewed: 06/29/2012 [facility] Patient Information 2015 [employer], [place]. This information is not intended to replace advice given to you by your health care provider. Make sure you discuss any questions you have with your health care provider. Acute Bronchitis Bronchitis is inflammation of the airways that extend from the windpipe into the lungs (bronchi). The inflammation often causes mucus to develop. This leads to a cough, which is the most common symptom of bronchitis.  In acute bronchitis, the condition usually develops suddenly and goes away over time, usually in a couple weeks. Smoking, allergies, and asthma can make bronchitis worse. Repeated episodes of bronchitis may cause further lung problems.  CAUSES Acute bronchitis is most often caused by the same virus that causes a cold. The virus can spread from person to person (contagious) through coughing, sneezing, and touching contaminated objects. SIGNS AND SYMPTOMS  16. Cough.  17. Fever.  18. Coughing up mucus.  19. Body aches.  20. Chest congestion.  21. Chills.  22. Shortness of breath.  23. Sore throat.  DIAGNOSIS  Acute bronchitis is usually diagnosed through a physical exam. Your health care provider will also ask you questions about your medical history. Tests, such as chest X-rays, are sometimes done to rule out other conditions.  TREATMENT  Acute bronchitis usually goes away in a couple weeks. Oftentimes, no medical treatment is necessary. Medicines  are sometimes given for relief of fever or cough. Antibiotic medicines are usually not needed but may be prescribed in certain situations. In some cases, an inhaler may be recommended to help reduce shortness of breath and control the cough. A cool mist vaporizer may also be used to help thin bronchial secretions and make it easier to clear the chest.  HOME CARE INSTRUCTIONS  Get plenty of rest.   Drink enough fluids to keep your urine clear or pale yellow (unless you have a medical condition that requires fluid restriction). Increasing fluids may help thin your respiratory secretions (sputum) and reduce chest congestion, and it will prevent dehydration.   Take medicines only as directed by your health care provider.  If you were prescribed an antibiotic medicine, finish it all even if you start to feel better.  Avoid smoking and secondhand smoke. Exposure to cigarette smoke or irritating chemicals will make bronchitis worse. If you are a smoker, consider using nicotine gum or skin patches to help control withdrawal symptoms. Quitting smoking will help your lungs heal faster.   Reduce the chances of another bout of acute bronchitis by washing your hands frequently, avoiding people with cold symptoms, and trying  not to touch your hands to your mouth, nose, or eyes.   Keep all follow-up visits as directed by your health care provider.  SEEK MEDICAL CARE IF: Your symptoms do not improve after 1 week of treatment.  SEEK IMMEDIATE MEDICAL CARE IF:  You develop an increased fever or chills.   You have chest pain.   You have severe shortness of breath.  You have bloody sputum.   You develop dehydration.  You faint or repeatedly feel like you are going to pass out.  You develop repeated vomiting.  You develop a severe headache. MAKE SURE YOU:   Understand these instructions.  Will watch your condition.  Will get help right away if you are not doing well or get  worse. Document Released: 02/19/2004 Document Revised: 05/28/2013 Document Reviewed: 07/04/2012 [facility] Patient Information 2015 [place], [place]. This information is not intended to replace advice given to you by your health care provider. Make sure you discuss any questions you have with your health care provider.

## 2013-12-07 NOTE — Progress Notes (Signed)
[facility] [address], [place] [address] [phone]  Date:  12/07/2013   Name:  Christina Parker   DOB:  [birth year]   MRN:  [id number]  PCP:  No primary care provider on file.    Chief Complaint: Cough   History of Present Illness:  Christina Parker is a 38 y.o. very pleasant female patient who presents with the following:  History of reactive airway disease. Has taken advair and singulair in the past. Now has 10 days duration of upper airway symptoms. Initially had a purulent nasal drainge Now has mucoid thick post nasal drainage Cough is not productive and persistent with an occasional wheeze. No shortness of breath No fever or chills. No nausea or vomiting. No stool change.  Denies other complaint or health concern today.  No improvement with over the counter medications or other home remedies.   Patient Active Problem List   Diagnosis Date Noted  . Reactive airway disease 12/07/2013    Past Medical History  Diagnosis Date  . Anemia   . MVP (mitral valve prolapse)   . History of vitamin B deficiency   . History of vitamin D deficiency   . Family history of anesthesia complication     pt's daughter has PO N&V  . Vaginal delivery 1997, 2005    Past Surgical History  Procedure Laterality Date  . No past surgeries    . Tubal ligation    . Dilitation & currettage/hystroscopy with novasure ablation N/A 08/02/2013    Procedure: DILATATION & CURETTAGE/HYSTEROSCOPY WITH NOVASURE ABLATION;  Surgeon: Marylynn Pearson, MD;  Location: [facility];  Service: Gynecology;  Laterality: N/A;    History  Substance Use Topics  . Smoking status: Never Smoker   . Smokeless tobacco: Not on file  . Alcohol Use: No    Family History  Problem Relation Age of Onset  . Diabetes Sister   . Diabetes Maternal Grandmother   . Hypertension Maternal Grandmother   . Hypertension Maternal Grandfather   . Diabetes Paternal Grandmother   . Hypertension Paternal  Grandmother   . Diabetes Paternal Grandfather   . Hypertension Paternal Grandfather     No Known Allergies  Medication list has been reviewed and updated.  Current Outpatient Prescriptions on File Prior to Visit  Medication Sig Dispense Refill  . Cholecalciferol (VITAMIN D) 2000 UNITS tablet Take 2,000 Units by mouth daily.    Marland Kitchen ibuprofen (ADVIL,MOTRIN) 600 MG tablet Take 1 tablet (600 mg total) by mouth every 6 (six) hours as needed. 30 tablet 0  . Multiple Vitamins-Minerals (MULTIVITAMIN WITH MINERALS) tablet Take 1 tablet by mouth daily.     No current facility-administered medications on file prior to visit.    Review of Systems:  As per HPI, otherwise negative.    Physical Examination: Filed Vitals:   12/07/13 1558  BP: 106/78  Pulse: 98  Temp: 99.4 F (37.4 C)  Resp: 18   Filed Vitals:   12/07/13 1558  Height: 5' 3"  (1.6 m)  Weight: 229 lb (103.874 kg)   Body mass index is 40.58 kg/(m^2). Ideal Body Weight: Weight in (lb) to have BMI = 25: 140.8  GEN: obese, NAD, Non-toxic, A & O x 3 HEENT: Atraumatic, Normocephalic. Neck supple. No masses, No LAD. Ears and Nose: No external deformity. CV: RRR, No M/G/R. No JVD. No thrill. No extra heart sounds. PULM: CTA B, scattered wheezes with poor air movement, no crackles, rhonchi. No retractions. No resp. distress.  No accessory muscle use. ABD: S, NT, ND, +BS. No rebound. No HSM. EXTR: No c/c/e NEURO Normal gait.  PSYCH: Normally interactive. Conversant. Not depressed or anxious appearing.  Calm demeanor.    Assessment and Plan: Acute bronchitis Marked improvement in air movement with neb Albuterol zpak Phen c cod   Signed,  Ellison Carwin, MD   a

## 2013-12-12 ENCOUNTER — Ambulatory Visit (INDEPENDENT_AMBULATORY_CARE_PROVIDER_SITE_OTHER): Payer: Federal, State, Local not specified - PPO | Admitting: Family Medicine

## 2013-12-12 VITALS — BP 118/72 | HR 106 | Temp 99.5°F | Resp 20 | Ht 63.0 in | Wt 227.6 lb

## 2013-12-12 DIAGNOSIS — R059 Cough, unspecified: Secondary | ICD-10-CM

## 2013-12-12 DIAGNOSIS — R05 Cough: Secondary | ICD-10-CM

## 2013-12-12 MED ORDER — BENZONATATE 100 MG PO CAPS: 100.0000 mg | ORAL_CAPSULE | Freq: Three times a day (TID) | ORAL | Status: DC | PRN

## 2013-12-12 MED ORDER — PREDNISONE 20 MG PO TABS: 40.0000 mg | ORAL_TABLET | Freq: Every day | ORAL | Status: DC

## 2013-12-12 MED ORDER — OMEPRAZOLE 40 MG PO CPDR: 40.0000 mg | DELAYED_RELEASE_CAPSULE | Freq: Every day | ORAL | Status: DC

## 2013-12-12 NOTE — Patient Instructions (Signed)
Your ongoing cough is happening due to some irritation in the back of your throat causing you to cough.  Please start taking the prilosec 40 mg every day in case reflux is causing the irritation. Please take prednisone 49m per day for 5 days for inflammation. Please continue to use the albuterol inhaler as needed for the cough or wheezing.  Please take the tessalon, 1-2 every 8 hours as needed for cough.  You can continue the cough medicine at night that you've been using.  If your cough is not better by this weekend, please return to clinic.

## 2013-12-12 NOTE — Progress Notes (Signed)
Subjective:    Patient ID: MYSTIC LABO, female    DOB: 02-11-1975, 38 y.o.   MRN: [id number]  No PCP Per Patient  Chief Complaint  Patient presents with  . Cough    not better from last OV>  still coughing.  finished zpak.    Patient Active Problem List   Diagnosis Date Noted  . Reactive airway disease 12/07/2013   Prior to Admission medications   Medication Sig Start Date End Date Taking? Authorizing Provider  albuterol (PROVENTIL HFA;VENTOLIN HFA) 108 (90 BASE) MCG/ACT inhaler Inhale 2 puffs into the lungs every 4 (four) hours as needed for wheezing or shortness of breath (cough, shortness of breath or wheezing.). 12/07/13  Yes Roselee Culver, MD  Cholecalciferol (VITAMIN D) 2000 UNITS tablet Take 2,000 Units by mouth daily.   Yes Historical Provider, MD  ibuprofen (ADVIL,MOTRIN) 600 MG tablet Take 1 tablet (600 mg total) by mouth every 6 (six) hours as needed. 08/02/13  Yes Marylynn Pearson, MD  Multiple Vitamins-Minerals (MULTIVITAMIN WITH MINERALS) tablet Take 1 tablet by mouth daily.   Yes Historical Provider, MD  promethazine-codeine (PHENERGAN WITH CODEINE) 6.25-10 MG/5ML syrup Take 5-10 mLs by mouth every 4 (four) hours as needed for cough. 12/07/13  Yes Roselee Culver, MD   Medications, allergies, past medical history, surgical history, family history, social history and problem list reviewed and updated.  HPI  32 yof non-smoker with PMH asthma as a child returns to clinic for continued cough.  She was seen here 5 days ago and diagnosed with acute bronchitis. Her main sx at that time were non productive cough and nasal drainage. She was given duoned breathing trmnt due to wheezing at that appt. She felt better after this. She was sent home with albuterol prn, azithro, and phenergan with codeine.   Today she states she has intermittently felt better for short periods over the past few days but overall the cough and rhinorrhea have not improved. She has finished the  course of azithro. She has been using the albuterol every 4 hrs every day for the past 5 days. She uses this for the cough as she thinks it helps with that. She has not noticed any wheezing. The cough has become prod with white/yellow sputum for past few days. She feels the cough is due to irritation and a tickle in the back of her throat. Rhinorrhea has continued. No ear pain, fever, chills, abd pain, cp, n/v, or diarrhea. The phenergan with codeine helps her to sleep.  Hx of gerd. Prilosec as needed, maybe once per month. No discomfort after meals or with lying flat recently.    She has seasonal allergies and has been using daily zyrtec and flonase for past 3-4 wks.   HR slightly elevated today. Denies CP, palps.   Review of Systems See HPI.     Objective:   Physical Exam  Constitutional: She appears well-developed and well-nourished.  Non-toxic appearance. She does not have a sickly appearance. She appears ill. No distress.  BP 118/72 mmHg  Pulse 106  Temp(Src) 99.5 F (37.5 C) (Oral)  Resp 20  Ht 5' 3"  (1.6 m)  Wt 227 lb 9.6 oz (103.239 kg)  BMI 40.33 kg/m2  SpO2 99%   HENT:  Right Ear: Hearing, tympanic membrane, external ear and ear canal normal.  Left Ear: Hearing, tympanic membrane, external ear and ear canal normal.  Nose: Nose normal. No mucosal edema or rhinorrhea. Right sinus exhibits no maxillary sinus tenderness and  no frontal sinus tenderness. Left sinus exhibits no maxillary sinus tenderness and no frontal sinus tenderness.  Mouth/Throat: Uvula is midline, oropharynx is clear and moist and mucous membranes are normal. No oropharyngeal exudate, posterior oropharyngeal edema or posterior oropharyngeal erythema.  Eyes: Conjunctivae are normal.  Cardiovascular: Normal rate, regular rhythm, S1 normal, S2 normal and normal heart sounds.  Exam reveals no gallop.   No murmur heard. Pulmonary/Chest: Effort normal and breath sounds normal. No accessory muscle usage. No tachypnea.  No respiratory distress. She has no decreased breath sounds. She has no wheezes. She has no rhonchi. She has no rales.  Lymphadenopathy:       Head (right side): No submental, no submandibular and no tonsillar adenopathy present.       Head (left side): No submental, no submandibular and no tonsillar adenopathy present.    She has no cervical adenopathy.  Psychiatric: She has a normal mood and affect. Her speech is normal.   Peak airflow reading is 450, about 110 % of predicted.     Assessment & Plan:   83 yof non-smoker with PMH asthma as a child returns to clinic for continued cough.  Cough - Plan: predniSONE (DELTASONE) 20 MG tablet, benzonatate (TESSALON) 100 MG capsule, omeprazole (PRILOSEC) 40 MG capsule  --exam not concerning for bacterial infx, vitals reassuring other than mildly elevated hr which I attribute to pt not feeling well --ongoing cough despite azithro, albuterol, and phergan with codeine at home --due to tickle in throat --daily prilosec for possible gerd component --prednisone 5 day burst --tessalon  --continue phenergan/codeine night --continue albuterol inhaler as needed at home, peak flow reading wnl today  --rtc this weekend if cough not improved with these measures, could tx with 48 hrs tussionex at that point for possible cyclic cough   Julieta Gutting, PA-C Physician Assistant-Certified [facility]  12/12/2013 9:07 PM

## 2013-12-14 NOTE — Progress Notes (Signed)
Patient discussed and examined with Mr. Rosanne Sack. Agree with assessment and plan of care per his note. possible multifactorial cause of cough and agree with multiple approaches with RTC precautions.

## 2013-12-31 ENCOUNTER — Encounter: Payer: Self-pay | Admitting: Family Medicine

## 2013-12-31 ENCOUNTER — Ambulatory Visit (INDEPENDENT_AMBULATORY_CARE_PROVIDER_SITE_OTHER): Payer: Federal, State, Local not specified - PPO | Admitting: Family Medicine

## 2013-12-31 ENCOUNTER — Ambulatory Visit (INDEPENDENT_AMBULATORY_CARE_PROVIDER_SITE_OTHER): Payer: Federal, State, Local not specified - PPO

## 2013-12-31 VITALS — BP 118/79 | HR 101 | Temp 98.1°F | Resp 18 | Ht 63.5 in | Wt 217.0 lb

## 2013-12-31 DIAGNOSIS — R05 Cough: Secondary | ICD-10-CM

## 2013-12-31 DIAGNOSIS — R0602 Shortness of breath: Secondary | ICD-10-CM

## 2013-12-31 DIAGNOSIS — R059 Cough, unspecified: Secondary | ICD-10-CM

## 2013-12-31 DIAGNOSIS — R0789 Other chest pain: Secondary | ICD-10-CM

## 2013-12-31 LAB — BASIC METABOLIC PANEL
BUN: 12 mg/dL (ref 6–23)
CHLORIDE: 102 meq/L (ref 96–112)
CO2: 27 mEq/L (ref 19–32)
Calcium: 9.4 mg/dL (ref 8.4–10.5)
Creat: 0.75 mg/dL (ref 0.50–1.10)
Glucose, Bld: 97 mg/dL (ref 70–99)
POTASSIUM: 3.4 meq/L — AB (ref 3.5–5.3)
Sodium: 138 mEq/L (ref 135–145)

## 2013-12-31 LAB — CBC
HCT: 36 % (ref 36.0–46.0)
Hemoglobin: 12.5 g/dL (ref 12.0–15.0)
MCH: 26 pg (ref 26.0–34.0)
MCHC: 34.7 g/dL (ref 30.0–36.0)
MCV: 74.8 fL — ABNORMAL LOW (ref 78.0–100.0)
MPV: 8.5 fL — AB (ref 9.4–12.4)
Platelets: 376 10*3/uL (ref 150–400)
RBC: 4.81 MIL/uL (ref 3.87–5.11)
RDW: 15.7 % — ABNORMAL HIGH (ref 11.5–15.5)
WBC: 8.5 10*3/uL (ref 4.0–10.5)

## 2013-12-31 LAB — TSH: TSH: 0.678 u[IU]/mL (ref 0.350–4.500)

## 2013-12-31 LAB — SEDIMENTATION RATE: Sed Rate: 67 mm/hr — ABNORMAL HIGH (ref 0–22)

## 2013-12-31 NOTE — Progress Notes (Signed)
Subjective:    Patient ID: Christina Parker, female    DOB: 1975-10-04, 38 y.o.   MRN: [id number]  HPI Patient presents today for follow up of continued cough and SOB. Her cough started around the first of November. She was initially treated on 12/07/13 with zpack, albuterol, and phenergan with codeine. She returned 11/18 and was treated with prednisone 40 mg x 5 days. Over the last week, she has noticed swelling in feet, and hands. Feels SOB with any exertion. Feels nausea and has had decreased appetite. Has been watching her salt intake and dietary fat since she started feeling badly. Continues to attend exercise dance classes. Has to take frequent breaks.  Has had some intermittent improvement. Is sleeping ok. Using cough syrup sparingly.  Started having pressure in chest today, dull ache. Intermittent, noticed when she bent over to pick up laundry. No pressure now. No diaphoresis or SOB with this. No radiation. Feels "like a sore muscle. " This is mainly the reason she presents today as she was concerned about the pressure and swelling.   The patient works for [employer] care and voiced concern about being exposed to cigarette smoke and unclean conditions in patient's homes. She denies any stress at home or at work. She is happy with case load and hours. Her home life is not stressful.   Patient with a history of MVP, sees cardiologist at [facility]. She has an appointment for check up of this next month. She had asthma during childhood.   Review of Systems No fever or chills, no SOB at rest, occasional clear sputum. No sore throat, no ear pain, occasional clear nasal drainage. No significant nasal congestion, no headache.    Objective:   Physical Exam  Constitutional: She is oriented to person, place, and time. She appears well-developed and well-nourished.  obese  HENT:  Head: Normocephalic and atraumatic.  Right Ear: Tympanic membrane, external ear and ear canal normal.  Left Ear:  Tympanic membrane, external ear and ear canal normal.  Nose: Nose normal.  Mouth/Throat: Oropharynx is clear and moist.  Eyes: Conjunctivae are normal.  Neck: Normal range of motion. Neck supple.  Cardiovascular: Normal rate, regular rhythm and normal heart sounds.   Pulmonary/Chest: Effort normal and breath sounds normal. No respiratory distress. She has no wheezes. She has no rales. She exhibits no tenderness.  Some purse lipped breathing when she wasn't talking. Was able to carry on a prolonged conversation without any dyspnea.   Musculoskeletal: Normal range of motion. She exhibits no edema.  Lymphadenopathy:    She has no cervical adenopathy.  Neurological: She is alert and oriented to person, place, and time.  Skin: Skin is warm and dry.  Psychiatric: Her behavior is normal. Judgment and thought content normal.  Slightly anxious at times. Otherwise relaxed.   Vitals reviewed. BP 118/79 mmHg  Pulse 101  Temp(Src) 98.1 F (36.7 C) (Oral)  Resp 18  Ht 5' 3.5" (1.613 m)  Wt 217 lb (98.431 kg)  BMI 37.83 kg/m2  SpO2 100%  PF 400 L/min  LMP   CXR- [facility] reading (PRIMARY) by  Dr. Carlota Raspberry- no acute abnormality. EKG- NSR reviewed by Dr. Carlota Raspberry PF- 400    Assessment & Plan:  Discussed with Dr. Nyoka Cowden who reviewed Xray.  1. Shortness of breath - patient subjectively SOB with pulse ox 100, RR 18, able to converse comfortably. - DG Chest 2 View; Future - EKG 12-Lead - CBC - Basic metabolic panel - TSH - Sedimentation  Rate - D-dimer, quantitative - Ambulatory referral to Pulmonology  2. Pressure in chest - DG Chest 2 View; Future - EKG 12-Lead - CBC - Basic metabolic panel - TSH - Sedimentation Rate - D-dimer, quantitative -Follow up with cardiology as scheduled  3. Cough - DG Chest 2 View; Future - CBC - Basic metabolic panel - TSH - Sedimentation Rate - D-dimer, quantitative - Ambulatory referral to Pulmonology - Patient to continue PPI daily, albuterol as  needed, cough syrup as needed  Elby Beck, FNP-BC  [facility]  12/31/2013 9:39 PM

## 2014-01-01 ENCOUNTER — Other Ambulatory Visit: Payer: Self-pay | Admitting: *Deleted

## 2014-01-01 ENCOUNTER — Telehealth: Payer: Self-pay | Admitting: Family Medicine

## 2014-01-01 DIAGNOSIS — R059 Cough, unspecified: Secondary | ICD-10-CM

## 2014-01-01 DIAGNOSIS — R05 Cough: Secondary | ICD-10-CM

## 2014-01-01 DIAGNOSIS — R0602 Shortness of breath: Secondary | ICD-10-CM

## 2014-01-01 LAB — D-DIMER, QUANTITATIVE (NOT AT ARMC)

## 2014-01-02 ENCOUNTER — Ambulatory Visit: Payer: Federal, State, Local not specified - PPO | Admitting: Family Medicine

## 2014-01-02 ENCOUNTER — Other Ambulatory Visit (INDEPENDENT_AMBULATORY_CARE_PROVIDER_SITE_OTHER): Payer: Federal, State, Local not specified - PPO

## 2014-01-02 DIAGNOSIS — R05 Cough: Secondary | ICD-10-CM

## 2014-01-02 DIAGNOSIS — R0602 Shortness of breath: Secondary | ICD-10-CM

## 2014-01-02 DIAGNOSIS — R059 Cough, unspecified: Secondary | ICD-10-CM

## 2014-01-02 LAB — D-DIMER, QUANTITATIVE (NOT AT ARMC): D DIMER QUANT: 0.38 ug{FEU}/mL (ref 0.00–0.48)

## 2014-01-02 NOTE — Progress Notes (Signed)
Xray read and patient discussed with Ms. Carlean Purl. Agree with assessment and plan of care per her note.

## 2014-01-02 NOTE — Telephone Encounter (Signed)
Lm for pt with normal results, follow up as planned.

## 2014-01-02 NOTE — Telephone Encounter (Signed)
Stat lab results called in. D Dimer results 0.38 Reported to Dr. Carlota Raspberry-  Call and advise pt to follow up with pulmonary but any increased CP or SOB or worsening symptoms RTC or ED.

## 2014-01-16 ENCOUNTER — Encounter: Payer: Self-pay | Admitting: Cardiology

## 2014-01-16 ENCOUNTER — Ambulatory Visit (INDEPENDENT_AMBULATORY_CARE_PROVIDER_SITE_OTHER): Payer: Federal, State, Local not specified - PPO | Admitting: Cardiology

## 2014-01-16 VITALS — BP 108/82 | HR 92 | Ht 63.5 in | Wt 228.1 lb

## 2014-01-16 DIAGNOSIS — R0789 Other chest pain: Secondary | ICD-10-CM

## 2014-01-16 DIAGNOSIS — R0602 Shortness of breath: Secondary | ICD-10-CM | POA: Insufficient documentation

## 2014-01-16 DIAGNOSIS — I341 Nonrheumatic mitral (valve) prolapse: Secondary | ICD-10-CM | POA: Insufficient documentation

## 2014-01-16 LAB — BRAIN NATRIURETIC PEPTIDE: PRO B NATRI PEPTIDE: 7 pg/mL (ref 0.0–100.0)

## 2014-01-16 NOTE — Addendum Note (Signed)
Addended by: Stephannie Peters on: 01/16/2014 11:13 AM   Modules accepted: Orders

## 2014-01-16 NOTE — Addendum Note (Signed)
Addended by: Stanton Kidney on: 01/16/2014 11:14 AM   Modules accepted: Orders

## 2014-01-16 NOTE — Patient Instructions (Signed)
Your physician recommends that you continue on your current medications as directed. Please refer to the Current Medication list given to you today.  Lab today: BNP  Your physician has requested that you have an echocardiogram. Echocardiography is a painless test that uses sound waves to create images of your heart. It provides your doctor with information about the size and shape of your heart and how well your heart's chambers and valves are working. This procedure takes approximately one hour. There are no restrictions for this procedure.  No follow up is needed at this time with Dr. Radford Pax.  She will see you on an as needed basis.

## 2014-01-16 NOTE — Progress Notes (Signed)
[place], [place], [place]  [address] Phone: [phone] Fax:  [phone]  Date:  01/16/2014   ID:  Christina Parker, DOB 04-28-1975, MRN [id number]  PCP:  No PCP Per Patient  Cardiologist:  Fransico Him, MD    History of Present Illness: Christina Parker is a 38 y.o. female with a history of MVP who presents today for evaluation of exertional fatigue.  She recently had bronchitis and was treated with a Z pack.  She then was placed on steroids due to persistent cough.  She started getting LE edema that would come and go and her PCP told her it was due to the steroids.  She also was complaining of a dull ache in the mid sternal area that did not radiated anywhere and was not associated with nausea or diaphoresis.  This lasted intermittently for 4 days.  She rested except for going to work but was still exhausted.  Her chest pain resolved after 4 days.  She says that now she feels better but is still having problems with DOE and continues to have LE edema in the evening. Apparently her PCP called her to let her know that her sed rate was still elevated and she has an appt with Pulmonary next month.    Wt Readings from Last 3 Encounters:  01/16/14 228 lb 1.9 oz (103.475 kg)  12/31/13 217 lb (98.431 kg)  12/12/13 227 lb 9.6 oz (103.239 kg)     Past Medical History  Diagnosis Date  . Anemia   . MVP (mitral valve prolapse)   . History of vitamin B deficiency   . History of vitamin D deficiency   . Family history of anesthesia complication     pt's daughter has PO N&V  . Vaginal delivery 1997, 2005  . Asthma     As child    Current Outpatient Prescriptions  Medication Sig Dispense Refill  . albuterol (PROVENTIL HFA;VENTOLIN HFA) 108 (90 BASE) MCG/ACT inhaler Inhale 2 puffs into the lungs every 4 (four) hours as needed for wheezing or shortness of breath (cough, shortness of breath or wheezing.). 1 Inhaler 1  . cetirizine (ZYRTEC ALLERGY) 10 MG tablet Take 10 mg by mouth  daily.    . Cholecalciferol (VITAMIN D) 2000 UNITS tablet Take 2,000 Units by mouth daily.    Marland Kitchen CLOBEX 0.05 % shampoo as needed.    Marland Kitchen DIFFERIN 0.1 % cream as needed.    Marland Kitchen ibuprofen (ADVIL,MOTRIN) 600 MG tablet Take 1 tablet (600 mg total) by mouth every 6 (six) hours as needed. 30 tablet 0  . Multiple Vitamins-Minerals (MULTIVITAMIN WITH MINERALS) tablet Take 1 tablet by mouth daily.    Marland Kitchen omeprazole (PRILOSEC) 40 MG capsule Take 1 capsule (40 mg total) by mouth daily. 30 capsule 3  . promethazine-codeine (PHENERGAN WITH CODEINE) 6.25-10 MG/5ML syrup Take 5-10 mLs by mouth every 4 (four) hours as needed for cough. (Patient not taking: Reported on 01/16/2014) 120 mL 0   No current facility-administered medications for this visit.    Allergies:   No Known Allergies  Social History:  The patient  reports that she has never smoked. She does not have any smokeless tobacco history on file. She reports that she does not drink alcohol or use illicit drugs.   Family History:  The patient's family history includes Diabetes in her maternal grandmother, paternal grandfather, paternal grandmother, and sister; Hypertension in her maternal grandfather, maternal grandmother, paternal grandfather, and paternal grandmother.  ROS:  Please see the history of present illness.      All other systems reviewed and negative.   PHYSICAL EXAM: VS:  BP 108/82 mmHg  Pulse 92  Ht 5' 3.5" (1.613 m)  Wt 228 lb 1.9 oz (103.475 kg)  BMI 39.77 kg/m2 Well nourished, well developed, in no acute distress HEENT: normal Neck: no JVD Cardiac:  normal S1, S2; RRR; no murmur Lungs:  clear to auscultation bilaterally, no wheezing, rhonchi or rales Abd: soft, nontender, no hepatomegaly Ext: no edema Skin: warm and dry Neuro:  CNs 2-12 intact, no focal abnormalities noted  EKG:  NSR with no ST changes     ASSESSMENT AND PLAN: 1.  Atypical chest pain that is more likely related to musculoskeletal etiology from her chronic  cough.  EKG is nonsichemic.  CP has since resolved.  If echo is normal then no further w/u.   2.  MVP with no murmur on exam - I will get an echo to reassess 3.  SOB and chronic cough most likely related to post bronchitic asthma.  She is not wheezing on exam today.  I will get a 2D echo to assess LVF and make sure that she has not developed a DCM.  I will also check a BNP 4.  LE edema - there is no edema on exam today.  This was most likely related to steroids.  Folloup with me PRN pending results of studies. Signed, Fransico Him, MD [facility] 01/16/2014 10:18 AM

## 2014-01-17 ENCOUNTER — Ambulatory Visit (HOSPITAL_COMMUNITY): Payer: Federal, State, Local not specified - PPO | Attending: Cardiology | Admitting: Radiology

## 2014-01-17 DIAGNOSIS — I340 Nonrheumatic mitral (valve) insufficiency: Secondary | ICD-10-CM | POA: Diagnosis not present

## 2014-01-17 DIAGNOSIS — R0601 Orthopnea: Secondary | ICD-10-CM | POA: Insufficient documentation

## 2014-01-17 DIAGNOSIS — R05 Cough: Secondary | ICD-10-CM | POA: Insufficient documentation

## 2014-01-17 DIAGNOSIS — I341 Nonrheumatic mitral (valve) prolapse: Secondary | ICD-10-CM | POA: Diagnosis present

## 2014-01-17 DIAGNOSIS — R0789 Other chest pain: Secondary | ICD-10-CM

## 2014-01-17 DIAGNOSIS — R0602 Shortness of breath: Secondary | ICD-10-CM

## 2014-01-17 DIAGNOSIS — R079 Chest pain, unspecified: Secondary | ICD-10-CM | POA: Diagnosis not present

## 2014-01-17 NOTE — Progress Notes (Signed)
Echocardiogram performed.  

## 2014-01-30 ENCOUNTER — Ambulatory Visit: Payer: Federal, State, Local not specified - PPO | Admitting: Cardiology

## 2014-02-18 ENCOUNTER — Ambulatory Visit (INDEPENDENT_AMBULATORY_CARE_PROVIDER_SITE_OTHER): Payer: Federal, State, Local not specified - PPO | Admitting: Pulmonary Disease

## 2014-02-18 ENCOUNTER — Encounter: Payer: Self-pay | Admitting: Pulmonary Disease

## 2014-02-18 VITALS — BP 104/76 | HR 86 | Ht 62.0 in | Wt 232.0 lb

## 2014-02-18 DIAGNOSIS — R05 Cough: Secondary | ICD-10-CM

## 2014-02-18 DIAGNOSIS — J45901 Unspecified asthma with (acute) exacerbation: Secondary | ICD-10-CM

## 2014-02-18 DIAGNOSIS — R058 Other specified cough: Secondary | ICD-10-CM

## 2014-02-18 MED ORDER — FLUTICASONE PROPIONATE 50 MCG/ACT NA SUSP: 2.0000 | Freq: Every day | NASAL | Status: AC

## 2014-02-18 NOTE — Patient Instructions (Signed)
Will schedule breathing test (PFT) Use nasal irrigation (saline sinus rinse) daily for one week, then as needed Fluticasone two sprays each nostril daily for one week, then as needed Zyrtec daily for one week, then as needed Albuterol two puffs up to four times daily as needed for cough, wheeze, or chest congestion Follow up in 6 weeks

## 2014-02-18 NOTE — Progress Notes (Deleted)
   Subjective:    Patient ID: Christina Parker, female    DOB: April 23, 1975, 39 y.o.   MRN: [id number]  HPI    Review of Systems  Constitutional: Negative for fever and unexpected weight change.  HENT: Negative for congestion, dental problem, ear pain, nosebleeds, postnasal drip, rhinorrhea, sinus pressure, sneezing, sore throat and trouble swallowing.   Eyes: Negative for redness and itching.  Respiratory: Positive for cough and shortness of breath. Negative for chest tightness and wheezing.   Cardiovascular: Positive for chest pain and palpitations. Negative for leg swelling.  Gastrointestinal: Negative for nausea and vomiting.  Genitourinary: Negative for dysuria.  Musculoskeletal: Positive for joint swelling.  Skin: Negative for rash.  Neurological: Negative for headaches.  Hematological: Does not bruise/bleed easily.  Psychiatric/Behavioral: Negative for dysphoric mood. The patient is not nervous/anxious.        Objective:   Physical Exam        Assessment & Plan:

## 2014-02-18 NOTE — Progress Notes (Signed)
Chief Complaint  Patient presents with  . Pulmonary Consult    Referred by Dr Carlean Purl for Asthma/Bornchitis x 8 weeks ago. Pt reports that symptoms have resolved. Cxr in December 2015    History of Present Illness: Christina Parker is a 39 y.o. female for evaluation of asthma.  She was doing okay until November 2015.  She developed bronchitis.  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  Her breathing got much worse.  She went to Urgent Care and was told she had an asthma attack.  She was tx with zithromax, and prednisone >> these helped some.  She has been using albuterol also >> this helps.  She has not been on any other inhalers.  She uses albuterol once per week.  She gets a metal taste in her mouth.  She has been feeling sore in her chest, and prednisone caused her to get swelling everywhere.  She dances at church, and her breathing gets worse with this >> was never an issue before.  She uses albuterol before dancing, and this helps.  She gets a dry cough and occasional wheezing in her chest.  These have improved compared to November.    She was tried on prilosec, but this did not seem to help.  She used to get episodes of bronchitis as a child, and was on advair and singulair as a child.  She denies any sick exposures.  Her son has allergies and gets allergy shots.  She gets allergies in the Spring, and will use zyrtec.  She gets sinus congestion and post nasal drip at times, and was clearing her throat frequently in November >> not as much now.  She had the flu and pneumonia in 2004.  She denies history of tuberculosis.  She denies animal exposures.  She is from [place].  She rated her symptom severity as 10 out of 10 in November, but has improved to 2 out of 10 now.  CXR from 12/31/13 was normal.  She was seen in December 2015 by cardiology for chest pain >> this was felt to be musculoskeletal related to  coughing.  Tests: Echo 01/17/14 >> EF 55 to 60%  Gustava C Buccellato  has a past medical history of Anemia; MVP (mitral valve prolapse); History of vitamin B deficiency; History of vitamin D deficiency; Family history of anesthesia complication; Vaginal delivery (1997, 2005); and Asthma.  LYNNIX SCHONEMAN  has past surgical history that includes Tubal ligation and Dilatation & currettage/hysteroscopy with novasure ablation (N/A, 08/02/2013).  Prior to Admission medications   Medication Sig Start Date End Date Taking? Authorizing Provider  albuterol (PROVENTIL HFA;VENTOLIN HFA) 108 (90 BASE) MCG/ACT inhaler Inhale 2 puffs into the lungs every 4 (four) hours as needed for wheezing or shortness of breath (cough, shortness of breath or wheezing.). 12/07/13  Yes Roselee Culver, MD  cetirizine (ZYRTEC ALLERGY) 10 MG tablet Take 10 mg by mouth daily.   Yes Historical Provider, MD  Cholecalciferol (VITAMIN D) 2000 UNITS tablet Take 2,000 Units by mouth daily.   Yes Historical Provider, MD  CLOBEX 0.05 % shampoo as needed. 12/11/13  Yes Historical Provider, MD  DIFFERIN 0.1 % cream as needed. 12/14/13  Yes Historical Provider, MD  ibuprofen (ADVIL,MOTRIN) 600 MG tablet Take 1 tablet (600 mg total) by mouth every 6 (six) hours as needed. 08/02/13  Yes Marylynn Pearson, MD  Multiple Vitamins-Minerals (MULTIVITAMIN WITH MINERALS) tablet  Take 1 tablet by mouth daily.   Yes Historical Provider, MD  omeprazole (PRILOSEC) 40 MG capsule Take 1 capsule (40 mg total) by mouth daily. Patient taking differently: Take 40 mg by mouth as needed.  12/12/13  Yes Todd McVeigh, PA  promethazine-codeine (PHENERGAN WITH CODEINE) 6.25-10 MG/5ML syrup Take 5-10 mLs by mouth every 4 (four) hours as needed for cough. Patient not taking: Reported on 02/18/2014 12/07/13   Roselee Culver, MD    No Known Allergies  Her family history includes Allergies in her mother and son; Asthma in her son; Diabetes in her maternal  grandmother, paternal grandfather, paternal grandmother, and sister; Hypertension in her maternal grandfather, maternal grandmother, paternal grandfather, and paternal grandmother.  She  reports that she has been passively smoking.  She does not have any smokeless tobacco history on file. She reports that she does not drink alcohol or use illicit drugs.  Review of Systems  Constitutional: Negative for fever and unexpected weight change.  HENT: Negative for congestion, dental problem, ear pain, nosebleeds, postnasal drip, rhinorrhea, sinus pressure, sneezing, sore throat and trouble swallowing.   Eyes: Negative for redness and itching.  Respiratory: Positive for cough and shortness of breath. Negative for chest tightness and wheezing.   Cardiovascular: Positive for chest pain and palpitations. Negative for leg swelling.  Gastrointestinal: Negative for nausea and vomiting.  Genitourinary: Negative for dysuria.  Musculoskeletal: Positive for joint swelling.  Skin: Negative for rash.  Neurological: Negative for headaches.  Hematological: Does not bruise/bleed easily.  Psychiatric/Behavioral: Negative for dysphoric mood. The patient is not nervous/anxious.    Physical Exam:  General - No distress ENT - No sinus tenderness, no oral exudate, no LAN, no thyromegaly, TM clear, pupils equal/reactive Cardiac - s1s2 regular, no murmur, pulses symmetric Chest - No wheeze/rales/dullness, good air entry, normal respiratory excursion Back - No focal tenderness Abd - Soft, non-tender, no organomegaly, + bowel sounds Ext - No edema Neuro - Normal strength, cranial nerves intact Skin - No rashes Psych - Normal mood, and behavior   Lab Results  Component Value Date   WBC 8.5 12/31/2013   HGB 12.5 12/31/2013   HCT 36.0 12/31/2013   MCV 74.8* 12/31/2013   PLT 376 12/31/2013    Lab Results  Component Value Date   CREATININE 0.75 12/31/2013   BUN 12 12/31/2013   NA 138 12/31/2013   K 3.4*  12/31/2013   CL 102 12/31/2013   CO2 27 12/31/2013    Lab Results  Component Value Date   ALT 14 03/30/2013   AST 14 03/30/2013   ALKPHOS 68 03/30/2013   BILITOT 0.4 03/30/2013    Lab Results  Component Value Date   TSH 0.678 12/31/2013    BNP    Component Value Date/Time   PROBNP 7.0 01/16/2014 1046   Lab Results  Component Value Date   DDIMER 0.38 01/02/2014    Lab Results  Component Value Date   ESRSEDRATE 67* 12/31/2013    Discussion: She reports hx of allergies and asthmatic bronchitis.  She likely had upper respiratory infection and environmental exposure in November 2015 which triggered asthmatic bronchitis.  She is slowly improving, but has persistent symptoms.  There is no history of smoking, and her recent chest xray was normal.  Assessment/Plan:  Asthmatic bronchitis. Plan: - will arrange for pulmonary function test - continue prn albuterol - defer maintenance inhaler therapy for now - I don't think she needs additional prednisone or antibiotics at this time  Upper  airway cough syndrome related to post-nasal drip. My suspicion is this is the main issue with her cough at present. Plan: - nasal irrigation daily for at least one week - fluticasone two sprays daily for at least one week - prn zytrec - she might need further allergy testing   Chesley Mires, MD [facility] Pager:  [phone]

## 2014-03-07 ENCOUNTER — Ambulatory Visit (INDEPENDENT_AMBULATORY_CARE_PROVIDER_SITE_OTHER): Payer: Federal, State, Local not specified - PPO | Admitting: Pulmonary Disease

## 2014-03-07 DIAGNOSIS — J45901 Unspecified asthma with (acute) exacerbation: Secondary | ICD-10-CM | POA: Diagnosis not present

## 2014-03-07 LAB — PULMONARY FUNCTION TEST
DL/VA % pred: 126 %
DL/VA: 5.74 ml/min/mmHg/L
DLCO UNC: 23.4 ml/min/mmHg
DLCO unc % pred: 108 %
FEF 25-75 PRE: 1.78 L/s
FEF 25-75 Post: 1.95 L/sec
FEF2575-%Change-Post: 9 %
FEF2575-%PRED-PRE: 64 %
FEF2575-%Pred-Post: 70 %
FEV1-%Change-Post: -3 %
FEV1-%PRED-POST: 95 %
FEV1-%Pred-Pre: 98 %
FEV1-Post: 2.29 L
FEV1-Pre: 2.36 L
FEV1FVC-%Change-Post: -2 %
FEV1FVC-%Pred-Pre: 94 %
FEV6-%Change-Post: 0 %
FEV6-%PRED-POST: 104 %
FEV6-%PRED-PRE: 105 %
FEV6-POST: 2.97 L
FEV6-Pre: 2.99 L
FEV6FVC-%PRED-PRE: 102 %
FEV6FVC-%Pred-Post: 102 %
FVC-%Change-Post: 0 %
FVC-%PRED-PRE: 103 %
FVC-%Pred-Post: 103 %
FVC-POST: 2.99 L
FVC-PRE: 2.99 L
POST FEV6/FVC RATIO: 100 %
Post FEV1/FVC ratio: 77 %
Pre FEV1/FVC ratio: 79 %
Pre FEV6/FVC Ratio: 100 %
RV % pred: 89 %
RV: 1.28 L
TLC % PRED: 91 %
TLC: 4.35 L

## 2014-03-07 NOTE — Progress Notes (Signed)
PFT performed today. 

## 2014-03-13 ENCOUNTER — Telehealth: Payer: Self-pay | Admitting: Pulmonary Disease

## 2014-03-13 NOTE — Telephone Encounter (Signed)
LMTCB x 1 

## 2014-03-13 NOTE — Telephone Encounter (Signed)
PFT 03/07/14 >> FEV1 2.36 (98%), FEV1% 79, TLC 4.35 (91%), DLCO 108%, No BD   Will have my nurse inform pt that breathing test was normal.  No change to current tx plan.

## 2014-03-14 NOTE — Telephone Encounter (Signed)
Patient notified of results of PFT.  Patient says she is feeling great. No questions or concerns at this time.  Nothing further needed.

## 2014-04-08 ENCOUNTER — Encounter: Payer: Self-pay | Admitting: Pulmonary Disease

## 2014-04-08 ENCOUNTER — Ambulatory Visit (INDEPENDENT_AMBULATORY_CARE_PROVIDER_SITE_OTHER): Payer: Federal, State, Local not specified - PPO | Admitting: Pulmonary Disease

## 2014-04-08 VITALS — BP 120/84 | HR 92 | Temp 97.6°F | Ht 64.0 in | Wt 232.0 lb

## 2014-04-08 DIAGNOSIS — R058 Other specified cough: Secondary | ICD-10-CM

## 2014-04-08 DIAGNOSIS — R05 Cough: Secondary | ICD-10-CM

## 2014-04-08 NOTE — Progress Notes (Signed)
Chief Complaint  Patient presents with  . Follow-up    pt denies any breahting issues at this time.     History of Present Illness: Christina Parker is a 39 y.o. female with upper airway cough and mild asthma.  She is doing much better.  She is exercising more.  She denies cough, wheeze, or sputum.  Her sinus congestion is much better.   TESTS: Echo 01/17/14 >> EF 55 to 60% PFT 03/07/14 >> FEV1 2.36 (98%), FEV1% 79, TLC 4.35 (91%), DLCO 108%, No BD  Past medical hx >> MVP  Past surgical hx, Medications, Allergies, Family hx, Social hx all reviewed.   Physical Exam: Blood pressure 120/84, pulse 92, temperature 97.6 F (36.4 C), temperature source Oral, height 5' 4"  (1.626 m), weight 232 lb (105.235 kg), SpO2 98 %. Body mass index is 39.8 kg/(m^2).  General - No distress ENT - No sinus tenderness, no oral exudate, no LAN Cardiac - s1s2 regular, no murmur Chest - No wheeze/rales/dullness Back - No focal tenderness Abd - Soft, non-tender Ext - No edema Neuro - Normal strength Skin - No rashes Psych - normal mood, and behavior   Assessment/Plan:  Upper airway cough syndrome with post nasal drip >> improved. Plan: - continue flonase - prn zyrtec  Mild, intermittent asthma. Plan: - prn albuterol   Chesley Mires, MD [facility] Pager:  [phone]

## 2014-04-08 NOTE — Patient Instructions (Signed)
Follow up in 1 year.

## 2014-05-21 ENCOUNTER — Other Ambulatory Visit (INDEPENDENT_AMBULATORY_CARE_PROVIDER_SITE_OTHER): Payer: Federal, State, Local not specified - PPO

## 2014-05-21 ENCOUNTER — Ambulatory Visit (INDEPENDENT_AMBULATORY_CARE_PROVIDER_SITE_OTHER): Payer: Federal, State, Local not specified - PPO | Admitting: Internal Medicine

## 2014-05-21 ENCOUNTER — Encounter: Payer: Self-pay | Admitting: Internal Medicine

## 2014-05-21 VITALS — BP 118/80 | HR 89 | Temp 98.5°F | Resp 14 | Ht 63.0 in | Wt 234.1 lb

## 2014-05-21 DIAGNOSIS — Z0000 Encounter for general adult medical examination without abnormal findings: Secondary | ICD-10-CM

## 2014-05-21 DIAGNOSIS — K219 Gastro-esophageal reflux disease without esophagitis: Secondary | ICD-10-CM

## 2014-05-21 DIAGNOSIS — Z23 Encounter for immunization: Secondary | ICD-10-CM | POA: Diagnosis not present

## 2014-05-21 DIAGNOSIS — J4520 Mild intermittent asthma, uncomplicated: Secondary | ICD-10-CM | POA: Diagnosis not present

## 2014-05-21 LAB — CBC
HCT: 36.8 % (ref 36.0–46.0)
HEMOGLOBIN: 12.1 g/dL (ref 12.0–15.0)
MCHC: 33 g/dL (ref 30.0–36.0)
MCV: 77.4 fl — AB (ref 78.0–100.0)
PLATELETS: 359 10*3/uL (ref 150.0–400.0)
RBC: 4.75 Mil/uL (ref 3.87–5.11)
RDW: 15.9 % — AB (ref 11.5–15.5)
WBC: 10.3 10*3/uL (ref 4.0–10.5)

## 2014-05-21 LAB — COMPREHENSIVE METABOLIC PANEL
ALK PHOS: 71 U/L (ref 39–117)
ALT: 12 U/L (ref 0–35)
AST: 14 U/L (ref 0–37)
Albumin: 3.7 g/dL (ref 3.5–5.2)
BUN: 10 mg/dL (ref 6–23)
CHLORIDE: 105 meq/L (ref 96–112)
CO2: 26 mEq/L (ref 19–32)
CREATININE: 0.72 mg/dL (ref 0.40–1.20)
Calcium: 9.1 mg/dL (ref 8.4–10.5)
GFR: 116.7 mL/min (ref >60.00)
Glucose, Bld: 97 mg/dL (ref 70–99)
Potassium: 3.7 mEq/L (ref 3.5–5.1)
SODIUM: 138 meq/L (ref 135–145)
Total Bilirubin: 0.2 mg/dL (ref 0.2–1.2)
Total Protein: 7.4 g/dL (ref 6.0–8.3)

## 2014-05-21 LAB — LIPID PANEL
Cholesterol: 122 mg/dL (ref 0–200)
HDL: 30.5 mg/dL — ABNORMAL LOW (ref >39.00)
LDL Cholesterol: 67 mg/dL (ref 0–99)
NonHDL: 91.5
Total CHOL/HDL Ratio: 4
Triglycerides: 121 mg/dL (ref 0.0–149.0)
VLDL: 24.2 mg/dL (ref 0.0–40.0)

## 2014-05-21 LAB — HEMOGLOBIN A1C: HEMOGLOBIN A1C: 6.2 % (ref 4.6–6.5)

## 2014-05-21 NOTE — Progress Notes (Signed)
Pre visit review using our clinic review tool, if applicable. No additional management support is needed unless otherwise documented below in the visit note. 

## 2014-05-21 NOTE — Patient Instructions (Signed)
We have given you the tetanus shot today which is good for 10 years. We will check on the blood work today and call you back with the results.  Come back in about 1 year for a check up. If you have problems or questions before then please feel free to call our office.   Health Maintenance Adopting a healthy lifestyle and getting preventive care can go a long way to promote health and wellness. Talk with your health care provider about what schedule of regular examinations is right for you. This is a good chance for you to check in with your provider about disease prevention and staying healthy. In between checkups, there are plenty of things you can do on your own. Experts have done a lot of research about which lifestyle changes and preventive measures are most likely to keep you healthy. Ask your health care provider for more information. WEIGHT AND DIET  Eat a healthy diet  Be sure to include plenty of vegetables, fruits, low-fat dairy products, and lean protein.  Do not eat a lot of foods high in solid fats, added sugars, or salt.  Get regular exercise. This is one of the most important things you can do for your health.  Most adults should exercise for at least 150 minutes each week. The exercise should increase your heart rate and make you sweat (moderate-intensity exercise).  Most adults should also do strengthening exercises at least twice a week. This is in addition to the moderate-intensity exercise.  Maintain a healthy weight  Body mass index (BMI) is a measurement that can be used to identify possible weight problems. It estimates body fat based on height and weight. Your health care provider can help determine your BMI and help you achieve or maintain a healthy weight.  For females 87 years of age and older:   A BMI below 18.5 is considered underweight.  A BMI of 18.5 to 24.9 is normal.  A BMI of 25 to 29.9 is considered overweight.  A BMI of 30 and above is considered  obese.  Watch levels of cholesterol and blood lipids  You should start having your blood tested for lipids and cholesterol at 38 years of age, then have this test every 5 years.  You may need to have your cholesterol levels checked more often if:  Your lipid or cholesterol levels are high.  You are older than 38 years of age.  You are at high risk for heart disease.  CANCER SCREENING   Lung Cancer  Lung cancer screening is recommended for adults 84-34 years old who are at high risk for lung cancer because of a history of smoking.  A yearly low-dose CT scan of the lungs is recommended for people who:  Currently smoke.  Have quit within the past 15 years.  Have at least a 30-pack-year history of smoking. A pack year is smoking an average of one pack of cigarettes a day for 1 year.  Yearly screening should continue until it has been 15 years since you quit.  Yearly screening should stop if you develop a health problem that would prevent you from having lung cancer treatment.  Breast Cancer  Practice breast self-awareness. This means understanding how your breasts normally appear and feel.  It also means doing regular breast self-exams. Let your health care provider know about any changes, no matter how small.  If you are in your 20s or 30s, you should have a clinical breast exam (CBE) by a  a health care provider every 1-3 years as part of a regular health exam.  If you are 40 or older, have a CBE every year. Also consider having a breast X-ray (mammogram) every year.  If you have a family history of breast cancer, talk to your health care provider about genetic screening.  If you are at high risk for breast cancer, talk to your health care provider about having an MRI and a mammogram every year.  Breast cancer gene (BRCA) assessment is recommended for women who have family members with BRCA-related cancers. BRCA-related cancers  include:  Breast.  Ovarian.  Tubal.  Peritoneal cancers.  Results of the assessment will determine the need for genetic counseling and BRCA1 and BRCA2 testing. Cervical Cancer Routine pelvic examinations to screen for cervical cancer are no longer recommended for nonpregnant women who are considered low risk for cancer of the pelvic organs (ovaries, uterus, and vagina) and who do not have symptoms. A pelvic examination may be necessary if you have symptoms including those associated with pelvic infections. Ask your health care provider if a screening pelvic exam is right for you.   The Pap test is the screening test for cervical cancer for women who are considered at risk.  If you had a hysterectomy for a problem that was not cancer or a condition that could lead to cancer, then you no longer need Pap tests.  If you are older than 65 years, and you have had normal Pap tests for the past 10 years, you no longer need to have Pap tests.  If you have had past treatment for cervical cancer or a condition that could lead to cancer, you need Pap tests and screening for cancer for at least 20 years after your treatment.  If you no longer get a Pap test, assess your risk factors if they change (such as having a new sexual partner). This can affect whether you should start being screened again.  Some women have medical problems that increase their chance of getting cervical cancer. If this is the case for you, your health care provider may recommend more frequent screening and Pap tests.  The human papillomavirus (HPV) test is another test that may be used for cervical cancer screening. The HPV test looks for the virus that can cause cell changes in the cervix. The cells collected during the Pap test can be tested for HPV.  The HPV test can be used to screen women 30 years of age and older. Getting tested for HPV can extend the interval between normal Pap tests from three to five years.  An HPV  test also should be used to screen women of any age who have unclear Pap test results.  After 38 years of age, women should have HPV testing as often as Pap tests.  Colorectal Cancer  This type of cancer can be detected and often prevented.  Routine colorectal cancer screening usually begins at 38 years of age and continues through 38 years of age.  Your health care provider may recommend screening at an earlier age if you have risk factors for colon cancer.  Your health care provider may also recommend using home test kits to check for hidden blood in the stool.  A small camera at the end of a tube can be used to examine your colon directly (sigmoidoscopy or colonoscopy). This is done to check for the earliest forms of colorectal cancer.  Routine screening usually begins at age 50.  Direct examination   of the colon should be repeated every 5-10 years through 38 years of age. However, you may need to be screened more often if early forms of precancerous polyps or small growths are found. Skin Cancer  Check your skin from head to toe regularly.  Tell your health care provider about any new moles or changes in moles, especially if there is a change in a mole's shape or color.  Also tell your health care provider if you have a mole that is larger than the size of a pencil eraser.  Always use sunscreen. Apply sunscreen liberally and repeatedly throughout the day.  Protect yourself by wearing long sleeves, pants, a wide-brimmed hat, and sunglasses whenever you are outside. HEART DISEASE, DIABETES, AND HIGH BLOOD PRESSURE   Have your blood pressure checked at least every 1-2 years. High blood pressure causes heart disease and increases the risk of stroke.  If you are between 55 years and 79 years old, ask your health care provider if you should take aspirin to prevent strokes.  Have regular diabetes screenings. This involves taking a blood sample to check your fasting blood sugar  level.  If you are at a normal weight and have a low risk for diabetes, have this test once every three years after 38 years of age.  If you are overweight and have a high risk for diabetes, consider being tested at a younger age or more often. PREVENTING INFECTION  Hepatitis B  If you have a higher risk for hepatitis B, you should be screened for this virus. You are considered at high risk for hepatitis B if:  You were born in a country where hepatitis B is common. Ask your health care provider which countries are considered high risk.  Your parents were born in a high-risk country, and you have not been immunized against hepatitis B (hepatitis B vaccine).  You have HIV or AIDS.  You use needles to inject street drugs.  You live with someone who has hepatitis B.  You have had sex with someone who has hepatitis B.  You get hemodialysis treatment.  You take certain medicines for conditions, including cancer, organ transplantation, and autoimmune conditions. Hepatitis C  Blood testing is recommended for:  Everyone born from 1945 through 1965.  Anyone with known risk factors for hepatitis C. Sexually transmitted infections (STIs)  You should be screened for sexually transmitted infections (STIs) including gonorrhea and chlamydia if:  You are sexually active and are younger than 38 years of age.  You are older than 38 years of age and your health care provider tells you that you are at risk for this type of infection.  Your sexual activity has changed since you were last screened and you are at an increased risk for chlamydia or gonorrhea. Ask your health care provider if you are at risk.  If you do not have HIV, but are at risk, it may be recommended that you take a prescription medicine daily to prevent HIV infection. This is called pre-exposure prophylaxis (PrEP). You are considered at risk if:  You are sexually active and do not regularly use condoms or know the HIV status  of your partner(s).  You take drugs by injection.  You are sexually active with a partner who has HIV. Talk with your health care provider about whether you are at high risk of being infected with HIV. If you choose to begin PrEP, you should first be tested for HIV. You should then be tested every   3 months for as long as you are taking PrEP.  PREGNANCY   If you are premenopausal and you may become pregnant, ask your health care provider about preconception counseling.  If you may become pregnant, take 400 to 800 micrograms (mcg) of folic acid every day.  If you want to prevent pregnancy, talk to your health care provider about birth control (contraception). OSTEOPOROSIS AND MENOPAUSE   Osteoporosis is a disease in which the bones lose minerals and strength with aging. This can result in serious bone fractures. Your risk for osteoporosis can be identified using a bone density scan.  If you are 65 years of age or older, or if you are at risk for osteoporosis and fractures, ask your health care provider if you should be screened.  Ask your health care provider whether you should take a calcium or vitamin D supplement to lower your risk for osteoporosis.  Menopause may have certain physical symptoms and risks.  Hormone replacement therapy may reduce some of these symptoms and risks. Talk to your health care provider about whether hormone replacement therapy is right for you.  HOME CARE INSTRUCTIONS   Schedule regular health, dental, and eye exams.  Stay current with your immunizations.   Do not use any tobacco products including cigarettes, chewing tobacco, or electronic cigarettes.  If you are pregnant, do not drink alcohol.  If you are breastfeeding, limit how much and how often you drink alcohol.  Limit alcohol intake to no more than 1 drink per day for nonpregnant women. One drink equals 12 ounces of beer, 5 ounces of wine, or 1 ounces of hard liquor.  Do not use street  drugs.  Do not share needles.  Ask your health care provider for help if you need support or information about quitting drugs.  Tell your health care provider if you often feel depressed.  Tell your health care provider if you have ever been abused or do not feel safe at home. Document Released: 07/27/2010 Document Revised: 05/28/2013 Document Reviewed: 12/13/2012 [employer] Patient Information 2015 [employer]. This information is not intended to replace advice given to you by your health care provider. Make sure you discuss any questions you have with your health care provider.  

## 2014-05-24 ENCOUNTER — Encounter: Payer: Self-pay | Admitting: Internal Medicine

## 2014-05-24 DIAGNOSIS — K219 Gastro-esophageal reflux disease without esophagitis: Secondary | ICD-10-CM | POA: Insufficient documentation

## 2014-05-24 NOTE — Assessment & Plan Note (Addendum)
Uses albuterol prn during allergy season. She is using zyrtec for allergies right now and is using albuterol 2 times per week. No need for steroids or antibiotics. Mild intermittent. She has flonase but is not currently using it and have asked to her resume for better control of her allergies.

## 2014-05-24 NOTE — Assessment & Plan Note (Signed)
Taking prilosec as needed, she does not take daily. At this time no indication for further work up and will continue to monitor.

## 2014-05-24 NOTE — Assessment & Plan Note (Signed)
Checking for complications with labs today. She does have the acid reflux which could be resultant from her weight. Talked to her about portion control as well as exercise. She is not able to make many changes today but she will work on exercise as she knows this will be good for her breathing as well.

## 2014-05-24 NOTE — Progress Notes (Signed)
   Subjective:    Patient ID: Christina Parker, female    DOB: 1975-09-15, 39 y.o.   MRN: [id number]  HPI The patient is a 39 YO female who is coming in today for her weight. She does have some past history of some breathing difficulty due to allergies. When she is not having allergies she never uses the albuterol. Denies hospitalization for her breathing. She has struggled with weight her whole life. She is not dieting right now and is not exercising. She knows that she should eat better but finds it hard to keep motivated. She has not tried dieting in some time. She has never really lost a significant amount of weight and kept it off in the past.   PMH, [facility], social history reviewed and updated with patient.   Review of Systems  Constitutional: Negative for fever, activity change, appetite change, fatigue and unexpected weight change.  HENT: Positive for congestion, postnasal drip and rhinorrhea. Negative for ear discharge, ear pain, sinus pressure, sore throat and trouble swallowing.   Eyes: Negative.   Respiratory: Positive for shortness of breath. Negative for cough, chest tightness and wheezing.   Cardiovascular: Negative for chest pain, palpitations and leg swelling.  Gastrointestinal: Negative for nausea, abdominal pain, diarrhea, constipation and abdominal distention.  Musculoskeletal: Negative.   Skin: Negative.   Psychiatric/Behavioral: Negative.       Objective:   Physical Exam  Constitutional: She is oriented to person, place, and time. She appears well-developed and well-nourished.  Overweight  HENT:  Head: Normocephalic and atraumatic.  Eyes: EOM are normal.  Neck: Normal range of motion.  Cardiovascular: Normal rate and regular rhythm.   Pulmonary/Chest: Effort normal. No respiratory distress. She has no wheezes.  Abdominal: Soft. Bowel sounds are normal. She exhibits no distension. There is no tenderness.  Musculoskeletal: She exhibits no edema.  Neurological: She is  alert and oriented to person, place, and time.  Skin: Skin is warm and dry.  Psychiatric: She has a normal mood and affect.   Filed Vitals:   05/21/14 1458  BP: 118/80  Pulse: 89  Temp: 98.5 F (36.9 C)  TempSrc: Oral  Resp: 14  Height: 5' 3"  (1.6 m)  Weight: 234 lb 1.9 oz (106.196 kg)  SpO2: 98%      Assessment & Plan:  Tdap given at visit

## 2014-10-15 ENCOUNTER — Ambulatory Visit (INDEPENDENT_AMBULATORY_CARE_PROVIDER_SITE_OTHER): Payer: Federal, State, Local not specified - PPO

## 2014-10-15 ENCOUNTER — Ambulatory Visit: Payer: Federal, State, Local not specified - PPO | Admitting: Internal Medicine

## 2014-10-15 DIAGNOSIS — Z111 Encounter for screening for respiratory tuberculosis: Secondary | ICD-10-CM | POA: Diagnosis not present

## 2014-10-17 LAB — TB SKIN TEST
Induration: 0 mm
TB SKIN TEST: NEGATIVE

## 2014-10-28 ENCOUNTER — Ambulatory Visit: Payer: Federal, State, Local not specified - PPO | Admitting: Internal Medicine

## 2015-03-05 ENCOUNTER — Encounter: Payer: Self-pay | Admitting: Nurse Practitioner

## 2015-03-05 ENCOUNTER — Ambulatory Visit (INDEPENDENT_AMBULATORY_CARE_PROVIDER_SITE_OTHER): Payer: Federal, State, Local not specified - PPO | Admitting: Nurse Practitioner

## 2015-03-05 VITALS — BP 112/76 | HR 99 | Temp 99.0°F | Resp 16 | Ht 62.0 in | Wt 222.0 lb

## 2015-03-05 DIAGNOSIS — R1084 Generalized abdominal pain: Secondary | ICD-10-CM

## 2015-03-05 DIAGNOSIS — R109 Unspecified abdominal pain: Secondary | ICD-10-CM | POA: Insufficient documentation

## 2015-03-05 MED ORDER — SUCRALFATE 1 GM/10ML PO SUSP: 1.0000 g | Freq: Three times a day (TID) | ORAL | Status: DC

## 2015-03-05 MED ORDER — PANTOPRAZOLE SODIUM 40 MG PO TBEC: 40.0000 mg | DELAYED_RELEASE_TABLET | Freq: Every day | ORAL | Status: DC

## 2015-03-05 NOTE — Progress Notes (Signed)
Patient ID: Christina Parker, female    DOB: [birth year]  Age: 40 y.o. MRN: [id number]  CC: Acute Visit   HPI Christina Parker presents for CC of abdominal pain x 2 months.   1) Cramping, bloating, dull pain in stomach  After meals upper abdominal, happens 15 minutes after eating. Lasts for 1-2 hours Takes prilosec Poor appetite Drinking water  Stopped eating 9 pm went to bed at 11:30 pm  Denies heart burn or GERD   Stopped milk- switched to almond milk  Dramamine  Gas -X Healthy diet- watches tomatoes, and spicy   BM on Monday   Down 12 lbs since last year   Plain potatoes do well   History Tymia has a past medical history of Anemia; MVP (mitral valve prolapse); History of vitamin B deficiency; History of vitamin D deficiency; Family history of anesthesia complication; Vaginal delivery (1997, 2005); and Asthma.   She has past surgical history that includes Tubal ligation and Dilatation & currettage/hysteroscopy with novasure ablation (N/A, 08/02/2013).   Her family history includes Allergies in her mother and son; Asthma in her son; Diabetes in her maternal grandmother, paternal grandfather, paternal grandmother, and sister; Hypertension in her maternal grandfather, maternal grandmother, paternal grandfather, and paternal grandmother.She reports that she has been passively smoking.  She does not have any smokeless tobacco history on file. She reports that she does not drink alcohol or use illicit drugs.  Outpatient Prescriptions Prior to Visit  Medication Sig Dispense Refill  . albuterol (PROVENTIL HFA;VENTOLIN HFA) 108 (90 BASE) MCG/ACT inhaler Inhale 2 puffs into the lungs every 4 (four) hours as needed for wheezing or shortness of breath (cough, shortness of breath or wheezing.). 1 Inhaler 1  . cetirizine (ZYRTEC ALLERGY) 10 MG tablet Take 10 mg by mouth daily.    . Cholecalciferol (VITAMIN D) 2000 UNITS tablet Take 2,000 Units by mouth daily.    . fluticasone (FLONASE) 50  MCG/ACT nasal spray Place 2 sprays into both nostrils daily. 16 g 2  . ibuprofen (ADVIL,MOTRIN) 600 MG tablet Take 1 tablet (600 mg total) by mouth every 6 (six) hours as needed. 30 tablet 0  . Multiple Vitamins-Minerals (MULTIVITAMIN WITH MINERALS) tablet Take 1 tablet by mouth daily.    Marland Kitchen omeprazole (PRILOSEC) 40 MG capsule Take 1 capsule (40 mg total) by mouth daily. (Patient taking differently: Take 40 mg by mouth as needed. ) 30 capsule 3  . promethazine-codeine (PHENERGAN WITH CODEINE) 6.25-10 MG/5ML syrup Take 5-10 mLs by mouth every 4 (four) hours as needed for cough. (Patient not taking: Reported on 03/05/2015) 120 mL 0   No facility-administered medications prior to visit.    ROS Review of Systems  Constitutional: Negative for fever, chills, diaphoresis and fatigue.  Respiratory: Negative for chest tightness, shortness of breath and wheezing.   Cardiovascular: Negative for chest pain, palpitations and leg swelling.  Gastrointestinal: Positive for abdominal distention. Negative for nausea, vomiting, diarrhea, constipation, blood in stool and anal bleeding.       Cramping, bloating, dull pain  Skin: Negative for rash.  Neurological: Negative for dizziness and headaches.    Objective:  BP 112/76 mmHg  Pulse 99  Temp(Src) 99 F (37.2 C)  Resp 16  Ht 5' 2"  (1.575 m)  Wt 222 lb (100.699 kg)  BMI 40.59 kg/m2  SpO2 98%  Physical Exam  Constitutional: She is oriented to person, place, and time. She appears well-developed and well-nourished. No distress.  HENT:  Head: Normocephalic and atraumatic.  Right  Ear: External ear normal.  Left Ear: External ear normal.  Eyes: No scleral icterus.  Cardiovascular: Normal rate and regular rhythm.   Pulmonary/Chest: Effort normal and breath sounds normal. No respiratory distress. She has no wheezes. She has no rales. She exhibits no tenderness.  Abdominal: Soft. Bowel sounds are normal. She exhibits no distension and no mass. There is no  hepatosplenomegaly. There is tenderness. There is no rebound, no guarding and no CVA tenderness. No hernia.  Generalized  Neurological: She is alert and oriented to person, place, and time. Coordination normal.  Skin: Skin is warm and dry. No rash noted. She is not diaphoretic.  Psychiatric: She has a normal mood and affect. Her behavior is normal. Judgment and thought content normal.   Assessment & Plan:   Christina Parker was seen today for acute visit.  Diagnoses and all orders for this visit:  Generalized abdominal pain  Other orders -     pantoprazole (PROTONIX) 40 MG tablet; Take 1 tablet (40 mg total) by mouth daily. -     sucralfate (CARAFATE) 1 GM/10ML suspension; Take 10 mLs (1 g total) by mouth 4 (four) times daily -  with meals and at bedtime.  I have discontinued Ms. Railsback's omeprazole. I am also having her start on pantoprazole and sucralfate. Additionally, I am having her maintain her multivitamin with minerals, Vitamin D, ibuprofen, albuterol, promethazine-codeine, cetirizine, and fluticasone.  Meds ordered this encounter  Medications  . pantoprazole (PROTONIX) 40 MG tablet    Sig: Take 1 tablet (40 mg total) by mouth daily.    Dispense:  30 tablet    Refill:  3    Order Specific Question:  Supervising Provider    Answer:  Deborra Medina L [[id number]]  . sucralfate (CARAFATE) 1 GM/10ML suspension    Sig: Take 10 mLs (1 g total) by mouth 4 (four) times daily -  with meals and at bedtime.    Dispense:  420 mL    Refill:  0    Order Specific Question:  Supervising Provider    Answer:  Crecencio Mc [[id number]]     Follow-up: Return if symptoms worsen or fail to improve.

## 2015-03-05 NOTE — Assessment & Plan Note (Signed)
New onset Pt points to upper abdominal pain, but with exam it is generalized tenderness  Will try carafate first for 2-3 days then switch from omeprazole to Protonix to see if one helps vs. Another.  Next steps- Abdominal US - asked her to call or follow up with her PCP if no improvement Pt verbalized understanding FU prn worsening/failure to improve.

## 2015-03-05 NOTE — Progress Notes (Signed)
Pre visit review using our clinic review tool, if applicable. No additional management support is needed unless otherwise documented below in the visit note. 

## 2015-03-05 NOTE — Patient Instructions (Signed)
Try the carafate first then try the protonix next.   Food Choices for Gastroesophageal Reflux Disease, Adult When you have gastroesophageal reflux disease (GERD), the foods you eat and your eating habits are very important. Choosing the right foods can help ease the discomfort of GERD. WHAT GENERAL GUIDELINES DO I NEED TO FOLLOW?  Choose fruits, vegetables, whole grains, low-fat dairy products, and low-fat meat, fish, and poultry.  Limit fats such as oils, salad dressings, butter, nuts, and avocado.  Keep a food diary to identify foods that cause symptoms.  Avoid foods that cause reflux. These may be different for different people.  Eat frequent small meals instead of three large meals each day.  Eat your meals slowly, in a relaxed setting.  Limit fried foods.  Cook foods using methods other than frying.  Avoid drinking alcohol.  Avoid drinking large amounts of liquids with your meals.  Avoid bending over or lying down until 2-3 hours after eating. WHAT FOODS ARE NOT RECOMMENDED? The following are some foods and drinks that may worsen your symptoms: Vegetables Tomatoes. Tomato juice. Tomato and spaghetti sauce. Chili peppers. Onion and garlic. Horseradish. Fruits Oranges, grapefruit, and lemon (fruit and juice). Meats High-fat meats, fish, and poultry. This includes hot dogs, ribs, ham, sausage, salami, and bacon. Dairy Whole milk and chocolate milk. Sour cream. Cream. Butter. Ice cream. Cream cheese.  Beverages Coffee and tea, with or without caffeine. Carbonated beverages or energy drinks. Condiments Hot sauce. Barbecue sauce.  Sweets/Desserts Chocolate and cocoa. Donuts. Peppermint and spearmint. Fats and Oils High-fat foods, including [place] fries and potato chips. Other Vinegar. Strong spices, such as black pepper, white pepper, red pepper, cayenne, curry powder, cloves, ginger, and chili powder. The items listed above may not be a complete list of foods and  beverages to avoid. Contact your dietitian for more information.   This information is not intended to replace advice given to you by your health care provider. Make sure you discuss any questions you have with your health care provider.   Document Released: 01/11/2005 Document Revised: 02/01/2014 Document Reviewed: 11/15/2012 Elsevier Interactive Patient Education [employer].

## 2015-03-21 ENCOUNTER — Encounter: Payer: Self-pay | Admitting: Internal Medicine

## 2015-03-21 ENCOUNTER — Ambulatory Visit (INDEPENDENT_AMBULATORY_CARE_PROVIDER_SITE_OTHER): Payer: Federal, State, Local not specified - PPO | Admitting: Internal Medicine

## 2015-03-21 VITALS — BP 110/82 | HR 61 | Temp 98.6°F | Resp 12 | Ht 61.0 in | Wt 214.0 lb

## 2015-03-21 DIAGNOSIS — R1011 Right upper quadrant pain: Secondary | ICD-10-CM

## 2015-03-21 MED ORDER — PANTOPRAZOLE SODIUM 40 MG PO TBEC: 40.0000 mg | DELAYED_RELEASE_TABLET | Freq: Two times a day (BID) | ORAL | Status: DC

## 2015-03-21 NOTE — Patient Instructions (Signed)
We will check the ultrasound of the stomach to make sure we are not missing anything.   Double the protonix and take it before breakfast and before lunch or in the afternoon (whichever is easier). If we do not find anything in the ultrasound we will have you see the GI doctor to look more at the stomach.

## 2015-03-21 NOTE — Assessment & Plan Note (Signed)
Getting US abdomen to rule out additional causes such as gallbladder, or kidney problem. Suspect GERD which is still uncontrolled. Increase protonix to BID and depending on [place] treat appropriately. If no cause found refer to GI as she is still uncontrolled on protonix and with good GERD diet adherence.

## 2015-03-21 NOTE — Progress Notes (Signed)
Pre visit review using our clinic review tool, if applicable. No additional management support is needed unless otherwise documented below in the visit note. 

## 2015-03-21 NOTE — Progress Notes (Signed)
   Subjective:    Patient ID: Christina Parker, female    DOB: [birth year], 40 y.o.   MRN: [id number]  HPI The patient is a 40 YO female coming in for follow up on her abdominal pain. She was seen 1-2 weeks ago for this midline and diffuse abdominal pain. She was given carafate and protonix. She has also changed her diet radically per our advice. She is having less pain but still significant bloating with any food or water, pain all the time. Now 5-6/10 instead of 10/10 pain all the time. She would like to pursue finding out what is the cause. This is impacting her life. She is not able to eat anything without pain. Has given up all fried foods, pork, beef. Drinking only water. No sodas etc. Has chronic constipation which is better lately. No blood in her stool or black stools.   Review of Systems  Constitutional: Negative for fever, activity change, appetite change, fatigue and unexpected weight change.  HENT: Negative for congestion, ear discharge, ear pain, postnasal drip, rhinorrhea, sinus pressure, sore throat and trouble swallowing.   Eyes: Negative.   Respiratory: Negative for cough, chest tightness, shortness of breath and wheezing.   Cardiovascular: Negative for chest pain, palpitations and leg swelling.  Gastrointestinal: Positive for nausea, abdominal pain and abdominal distention. Negative for diarrhea and constipation.  Musculoskeletal: Negative.   Skin: Negative.   Psychiatric/Behavioral: Negative.       Objective:   Physical Exam  Constitutional: She is oriented to person, place, and time. She appears well-developed and well-nourished.  Overweight  HENT:  Head: Normocephalic and atraumatic.  Eyes: EOM are normal.  Neck: Normal range of motion.  Cardiovascular: Normal rate and regular rhythm.   Pulmonary/Chest: Effort normal. No respiratory distress. She has no wheezes.  Abdominal: Soft. Bowel sounds are normal. She exhibits no distension. There is tenderness.  Minimal  distention and diffuse pain worse in the RUQ and midline epigastric.   Musculoskeletal: She exhibits no edema.  Neurological: She is alert and oriented to person, place, and time.  Skin: Skin is warm and dry.  Psychiatric: She has a normal mood and affect.   Filed Vitals:   03/21/15 0807  BP: 110/82  Pulse: 61  Temp: 98.6 F (37 C)  TempSrc: Oral  Resp: 12  Height: 5' 1"  (1.549 m)  Weight: 214 lb (97.07 kg)  SpO2: 98%      Assessment & Plan:

## 2015-03-28 ENCOUNTER — Other Ambulatory Visit: Payer: Self-pay | Admitting: Internal Medicine

## 2015-03-28 ENCOUNTER — Ambulatory Visit
Admission: RE | Admit: 2015-03-28 | Discharge: 2015-03-28 | Disposition: A | Discharge status: Home / Self Care | Payer: Federal, State, Local not specified - PPO | Source: Ambulatory Visit | Attending: Internal Medicine | Admitting: Internal Medicine

## 2015-03-28 DIAGNOSIS — K8020 Calculus of gallbladder without cholecystitis without obstruction: Secondary | ICD-10-CM

## 2015-03-28 DIAGNOSIS — R1011 Right upper quadrant pain: Secondary | ICD-10-CM

## 2015-03-31 ENCOUNTER — Other Ambulatory Visit: Payer: Federal, State, Local not specified - PPO

## 2015-04-04 ENCOUNTER — Other Ambulatory Visit: Payer: Federal, State, Local not specified - PPO

## 2015-04-18 ENCOUNTER — Ambulatory Visit: Payer: Self-pay | Admitting: Surgery

## 2015-04-18 NOTE — H&P (Signed)
Christina Parker 04/18/2015 8:54 AM Location: [facility] Patient #: [id number] DOB: [birth year] Married / Language: English / Race: Black or African American Female  History of Present Illness Christina Parker A. Bryar Rennie MD; 04/18/2015 12:52 PM) Patient words: GB    Patient sent at the request of Dr. Sharlet Salina for gallbladder disease. He is a 3 month history of epigastric pain and nausea with bloating after eating. She was found to have gallstones 3 weeks ago on a routine ultrasound. There is no evidence of gallbladder wall thickening. She has watched her diet which helps her symptoms certainly has had a hard time with this. She said a history of reflux. Her pain locations in her epigastrium and right upper quadrant. It is made worse with eating and fatty greasy foods. Its made better with diet modification.  The patient is a 40 year old female.   Other Problems Christina Parker, CMA; 04/18/2015 8:55 AM) Asthma Back Pain Cholelithiasis Gastroesophageal Reflux Disease Heart murmur  Diagnostic Studies History Christina Parker, [place]; 04/18/2015 8:55 AM) Colonoscopy never Mammogram 1-3 years ago Pap Smear 1-5 years ago  Allergies Christina Parker, CMA; 04/18/2015 8:55 AM) No Known Drug Allergies 04/18/2015  Medication History Christina Parker, [place]; 04/18/2015 8:59 AM) Proventil HFA (108 (90 Base)MCG/ACT Aerosol Soln, Inhalation) Active. ZyrTEC Allergy (10MG Tablet, Oral) Active. Vitamin D (2000UNIT Tablet, Oral) Active. Flonase (50MCG/ACT Suspension, Nasal) Active. Multi Vitamin Daily (Oral) Active. Protonix (40MG Tablet DR, Oral) Active. Milk Thistle Active. Vitamin B12 (3000MCG/ML Liquid, Sublingual) Active. Fish Oil (1000MG Capsule, Oral) Active. CoQ-10 (100MG Capsule, Oral) Active. Vitamin E (1000UNIT Capsule, Oral) Active. Medications Reconciled  Social History Christina Parker, [place]; 04/18/2015 8:55 AM) Caffeine use Carbonated beverages,  Coffee, Tea. No alcohol use No drug use Tobacco use Never smoker.  Family History Christina Parker, [place]; 04/18/2015 8:55 AM) Diabetes Mellitus Father, Mother, Sister. Heart disease in female family member before age 63 Prostate Cancer Father. Thyroid problems Mother, Sister.  Pregnancy / Birth History Christina Parker, [place]; 04/18/2015 8:55 AM) Age at menarche 24 years. Gravida 3 Irregular periods Maternal age 29-20 Para 2     Review of Systems Christina Parker CMA; 04/18/2015 8:55 AM) General Present- Appetite Loss, Night Sweats and Weight Loss. Not Present- Chills, Fatigue, Fever and Weight Gain. HEENT Not Present- Earache, Hearing Loss, Hoarseness, Nose Bleed, Oral Ulcers, Ringing in the Ears, Seasonal Allergies, Sinus Pain, Sore Throat, Visual Disturbances, Wears glasses/contact lenses and Yellow Eyes. Respiratory Present- Chronic Cough. Not Present- Bloody sputum, Difficulty Breathing, Snoring and Wheezing. Breast Not Present- Breast Mass, Breast Pain, Nipple Discharge and Skin Changes. Cardiovascular Present- Difficulty Breathing Lying Down. Not Present- Chest Pain, Leg Cramps, Palpitations, Rapid Heart Rate, Shortness of Breath and Swelling of Extremities. Gastrointestinal Present- Abdominal Pain, Bloating, Change in Bowel Habits, Excessive gas, Indigestion and Nausea. Not Present- Bloody Stool, Chronic diarrhea, Constipation, Difficulty Swallowing, Gets full quickly at meals, Hemorrhoids, Rectal Pain and Vomiting. Female Genitourinary Not Present- Frequency, Nocturia, Painful Urination, Pelvic Pain and Urgency. Musculoskeletal Present- Back Pain. Not Present- Joint Pain, Joint Stiffness, Muscle Pain, Muscle Weakness and Swelling of Extremities. Neurological Not Present- Decreased Memory, Fainting, Headaches, Numbness, Seizures, Tingling, Tremor, Trouble walking and Weakness. Psychiatric Not Present- Anxiety, Bipolar, Change in Sleep Pattern, Depression, Fearful  and Frequent crying. Endocrine Not Present- Cold Intolerance, Excessive Hunger, Hair Changes, Heat Intolerance, Hot flashes and New Diabetes. Hematology Not Present- Easy Bruising, Excessive bleeding, Gland problems, HIV and Persistent Infections.  Vitals [employer] R. Parker CMA; 04/18/2015  8:55 AM) 04/18/2015 8:54 AM Weight: 214.5 lb Height: 61in Body Surface Area: 1.95 m Body Mass Index: 40.53 kg/m  BP: 118/78 (Sitting, Left Arm, Standard)      Physical Exam ([name] A. [place] MD; 04/18/2015 12:52 PM)  General Mental Status-Alert. General Appearance-Consistent with stated age. Hydration-Well hydrated. Voice-Normal.  Head and Neck Head-normocephalic, atraumatic with no lesions or palpable masses. Trachea-midline. Thyroid Gland Characteristics - normal size and consistency.  Chest and Lung Exam Chest and lung exam reveals -quiet, even and easy respiratory effort with no use of accessory muscles and on auscultation, normal breath sounds, no adventitious sounds and normal vocal resonance. Inspection Chest Wall - Normal. Back - normal.  Cardiovascular Cardiovascular examination reveals -normal heart sounds, regular rate and rhythm with no murmurs and normal pedal pulses bilaterally.  Abdomen Note: Minimal discomfort epigastrium to deep palpation. Negative Murphy sign.  Neurologic Neurologic evaluation reveals -alert and oriented x 3 with no impairment of recent or remote memory. Mental Status-Normal.  Musculoskeletal Normal Exam - Left-Upper Extremity Strength Normal and Lower Extremity Strength Normal. Normal Exam - Right-Upper Extremity Strength Normal and Lower Extremity Strength Normal.    Assessment & Plan (Zoye Chandra A. Mahamadou Weltz MD; 04/18/2015 12:53 PM)  SYMPTOMATIC CHOLELITHIASIS (K80.20) Impression: Discussed medical and surgical options for treatment of gallbladder disease. Recommended laparoscopic cholecystectomy with cholangiogram.  The procedure has been discussed with the patient. Risks of laparoscopic cholecystectomy include bleeding, infection, bile duct injury, leak, death, open surgery, diarrhea, other surgery, organ injury, blood vessel injury, DVT, and additional care.  Current Plans You are being scheduled for surgery - Our schedulers will call you.  You should hear from our office's scheduling department within 5 working days about the location, date, and time of surgery. We try to make accommodations for patient's preferences in scheduling surgery, but sometimes the OR schedule or the surgeon's schedule prevents [place] from making those accommodations.  If you have not heard from our office [phone]) in 5 working days, call the office and ask for your surgeon's nurse.  If you have other questions about your diagnosis, plan, or surgery, call the office and ask for your surgeon's nurse.  The anatomy & physiology of hepatobiliary & pancreatic function was discussed. The pathophysiology of gallbladder dysfunction was discussed. Natural history risks without surgery was discussed. I feel the risks of no intervention will lead to serious problems that outweigh the operative risks; therefore, I recommended cholecystectomy to remove the pathology. I explained laparoscopic techniques with possible need for an open approach. Probable cholangiogram to evaluate the bilary tract was explained as well.  Risks such as bleeding, infection, abscess, leak, injury to other organs, need for further treatment, heart attack, death, and other risks were discussed. I noted a good likelihood this will help address the problem. Possibility that this will not correct all abdominal symptoms was explained. Goals of post-operative recovery were discussed as well. We will work to minimize complications. An educational handout further explaining the pathology and treatment options was given as well. Questions were answered. The patient expresses  understanding & wishes to proceed with surgery.  Pt Education - CCS Laparosopic Post Op HCI (Gross)

## 2015-04-22 ENCOUNTER — Encounter (HOSPITAL_COMMUNITY)
Admission: RE | Admit: 2015-04-22 | Discharge: 2015-04-22 | Disposition: A | Discharge status: Home / Self Care | Payer: Federal, State, Local not specified - PPO | Source: Ambulatory Visit | Attending: Surgery | Admitting: Surgery

## 2015-04-22 ENCOUNTER — Encounter (HOSPITAL_COMMUNITY): Payer: Self-pay

## 2015-04-22 DIAGNOSIS — Z6839 Body mass index (BMI) 39.0-39.9, adult: Secondary | ICD-10-CM | POA: Diagnosis not present

## 2015-04-22 DIAGNOSIS — K8010 Calculus of gallbladder with chronic cholecystitis without obstruction: Secondary | ICD-10-CM | POA: Diagnosis not present

## 2015-04-22 HISTORY — DX: Cardiac murmur, unspecified: R01.1

## 2015-04-22 HISTORY — DX: Gastro-esophageal reflux disease without esophagitis: K21.9

## 2015-04-22 HISTORY — DX: Reserved for inherently not codable concepts without codable children: IMO0001

## 2015-04-22 HISTORY — DX: Pneumonia, unspecified organism: J18.9

## 2015-04-22 HISTORY — DX: Constipation, unspecified: K59.00

## 2015-04-22 LAB — CBC WITH DIFFERENTIAL/PLATELET
BASOS ABS: 0 10*3/uL (ref 0.0–0.1)
Basophils Relative: 1 %
EOS ABS: 0.2 10*3/uL (ref 0.0–0.7)
EOS PCT: 2 %
HCT: 38 % (ref 36.0–46.0)
Hemoglobin: 12.5 g/dL (ref 12.0–15.0)
LYMPHS ABS: 2.6 10*3/uL (ref 0.7–4.0)
LYMPHS PCT: 40 %
MCH: 27.1 pg (ref 26.0–34.0)
MCHC: 32.9 g/dL (ref 30.0–36.0)
MCV: 82.3 fL (ref 78.0–100.0)
MONO ABS: 0.4 10*3/uL (ref 0.1–1.0)
Monocytes Relative: 7 %
Neutro Abs: 3.3 10*3/uL (ref 1.7–7.7)
Neutrophils Relative %: 50 %
PLATELETS: 315 10*3/uL (ref 150–400)
RBC: 4.62 MIL/uL (ref 3.87–5.11)
RDW: 14.6 % (ref 11.5–15.5)
WBC: 6.4 10*3/uL (ref 4.0–10.5)

## 2015-04-22 LAB — COMPREHENSIVE METABOLIC PANEL
ALT: 24 U/L (ref 14–54)
ANION GAP: 10 (ref 5–15)
AST: 25 U/L (ref 15–41)
Albumin: 3.2 g/dL — ABNORMAL LOW (ref 3.5–5.0)
Alkaline Phosphatase: 62 U/L (ref 38–126)
BUN: 9 mg/dL (ref 6–20)
CHLORIDE: 109 mmol/L (ref 101–111)
CO2: 22 mmol/L (ref 22–32)
CREATININE: 0.81 mg/dL (ref 0.44–1.00)
Calcium: 8.9 mg/dL (ref 8.9–10.3)
Glucose, Bld: 134 mg/dL — ABNORMAL HIGH (ref 65–99)
POTASSIUM: 3.6 mmol/L (ref 3.5–5.1)
SODIUM: 141 mmol/L (ref 135–145)
Total Bilirubin: 0.4 mg/dL (ref 0.3–1.2)
Total Protein: 7.2 g/dL (ref 6.5–8.1)

## 2015-04-22 LAB — HCG, SERUM, QUALITATIVE: PREG SERUM: NEGATIVE

## 2015-04-22 NOTE — Pre-Procedure Instructions (Signed)
    LENIYA BREIT  04/22/2015     Your procedure is scheduled on Thursday, March 30  Report to [facility] at [phone] A.M.                Your surgery or procedure is scheduled for 12:15 pm   Call this number if you have problems the morning of [phone]     Remember:  Do not eat food or drink liquids after midnight Wednesday.  Take these medicines the morning of surgery with A SIP OF WATER : cetirizine (ZYRTEC ALLERGY), pantoprazole (PROTONIX).   Do not wear jewelry, make-up or nail polish.  Do not wear lotions, powders, or perfumes.  Do not shave 48 hours prior to surgery.  Do not bring valuables to the hospital.  [facility] is not responsible for any belongings or valuables.  Contacts, dentures or bridgework may not be worn into surgery.  Leave your suitcase in the car.  After surgery it may be brought to your room.  For patients admitted to the hospital, discharge time will be determined by your treatment team.  Patients discharged the day of surgery will not be allowed to drive home.   Name and phone number of your driver:   -  Special instructions:  Review  [facility] - Preparing For Surgery.  Please read over the following fact sheets that you were given. Pain Booklet, Coughing and Deep Breathing and Surgical Site Infection Prevention

## 2015-04-22 NOTE — Pre-Procedure Instructions (Signed)
    Christina Parker  04/22/2015     Your procedure is scheduled on Thursday, March 30  Report to [facility] at [phone] A.M.                Your surgery or procedure is scheduled for 12:15 pm   Call this number if you have problems the morning of surgery:[phone]     Remember:  Do not eat food or drink liquids after midnight Wednesday.  Take these medicines the morning of surgery with A SIP OF WATER : cetirizine (ZYRTEC ALLERGY), pantoprazole (PROTONIX).              Stop taking Aspirin, Coumadin, Plavix, Effient and Herbal medications.  Do not take any NSAIDs ie: Ibuprofen,  Advil,Naproxen or any medication containing Aspirin.  Do not wear jewelry, make-up or nail polish.   Do not wear lotions, powders, or perfumes.  Do not shave 48 hours prior to surgery.  Do not bring valuables to the hospital.  [facility] is not responsible for any belongings or valuables.  Contacts, dentures or bridgework may not be worn into surgery.  Leave your suitcase in the car.  After surgery it may be brought to your room.  For patients admitted to the hospital, discharge time will be determined by your treatment team.  Patients discharged the day of surgery will not be allowed to drive home.   Name and phone number of your driver:   -  Special instructions:  Review  [name] - Preparing For Surgery.  Please read over the following fact sheets that you were given. Pain Booklet, Coughing and Deep Breathing and Surgical Site Infection Prevention

## 2015-04-22 NOTE — Progress Notes (Addendum)
Christina Parker denies chest pain or shortness of breath.  Patient has a heart murmer, which was evaluated by Dr Radford Pax in 12/2013, when patient was hospitalized with shortness of breath. Patient had a normal ECHO and Dr Radford Pax did not need to continue to see patient unless condition changes.

## 2015-04-23 MED ORDER — DEXTROSE 5 % IV SOLN
3.0000 g | INTRAVENOUS | Status: AC
  Administered 2015-04-24: 3 g via INTRAVENOUS
  Filled 2015-04-23: qty 3000

## 2015-04-24 ENCOUNTER — Encounter (HOSPITAL_COMMUNITY)
Admission: RE | Disposition: A | Discharge status: Home / Self Care | Payer: Self-pay | Source: Ambulatory Visit | Attending: Surgery

## 2015-04-24 ENCOUNTER — Ambulatory Visit (HOSPITAL_COMMUNITY)
Admission: RE | Admit: 2015-04-24 | Discharge: 2015-04-24 | Disposition: A | Discharge status: Home / Self Care | Payer: Federal, State, Local not specified - PPO | Source: Ambulatory Visit | Attending: Surgery | Admitting: Surgery

## 2015-04-24 ENCOUNTER — Ambulatory Visit (HOSPITAL_COMMUNITY): Payer: Federal, State, Local not specified - PPO | Admitting: Anesthesiology

## 2015-04-24 ENCOUNTER — Ambulatory Visit (HOSPITAL_COMMUNITY): Payer: Federal, State, Local not specified - PPO

## 2015-04-24 ENCOUNTER — Encounter (HOSPITAL_COMMUNITY): Payer: Self-pay | Admitting: Anesthesiology

## 2015-04-24 DIAGNOSIS — K8010 Calculus of gallbladder with chronic cholecystitis without obstruction: Secondary | ICD-10-CM | POA: Diagnosis not present

## 2015-04-24 DIAGNOSIS — K810 Acute cholecystitis: Secondary | ICD-10-CM

## 2015-04-24 DIAGNOSIS — Z6839 Body mass index (BMI) 39.0-39.9, adult: Secondary | ICD-10-CM | POA: Insufficient documentation

## 2015-04-24 HISTORY — PX: CHOLECYSTECTOMY: SHX55

## 2015-04-24 SURGERY — LAPAROSCOPIC CHOLECYSTECTOMY WITH INTRAOPERATIVE CHOLANGIOGRAM
Anesthesia: General | Site: Abdomen

## 2015-04-24 MED ORDER — PROPOFOL 10 MG/ML IV BOLUS
INTRAVENOUS | Status: AC
Start: 2015-04-24 — End: 2015-04-24
  Filled 2015-04-24: qty 20

## 2015-04-24 MED ORDER — OXYCODONE-ACETAMINOPHEN 5-325 MG PO TABS: 1.0000 | ORAL_TABLET | ORAL | Status: DC | PRN

## 2015-04-24 MED ORDER — FENTANYL CITRATE (PF) 100 MCG/2ML IJ SOLN
INTRAMUSCULAR | Status: DC | PRN
  Administered 2015-04-24 (×2): 50 ug via INTRAVENOUS
  Administered 2015-04-24: 150 ug via INTRAVENOUS
  Administered 2015-04-24: 100 ug via INTRAVENOUS

## 2015-04-24 MED ORDER — DEXAMETHASONE SODIUM PHOSPHATE 10 MG/ML IJ SOLN
INTRAMUSCULAR | Status: DC | PRN
  Administered 2015-04-24: 10 mg via INTRAVENOUS

## 2015-04-24 MED ORDER — ONDANSETRON 4 MG PO TBDP: 4.0000 mg | ORAL_TABLET | Freq: Three times a day (TID) | ORAL | Status: DC | PRN

## 2015-04-24 MED ORDER — ONDANSETRON HCL 4 MG/2ML IJ SOLN
INTRAMUSCULAR | Status: AC
  Filled 2015-04-24: qty 2

## 2015-04-24 MED ORDER — IOPAMIDOL (ISOVUE-300) INJECTION 61%
INTRAVENOUS | Status: AC
  Filled 2015-04-24: qty 50

## 2015-04-24 MED ORDER — METOCLOPRAMIDE HCL 5 MG/ML IJ SOLN
10.0000 mg | Freq: Once | INTRAMUSCULAR | Status: AC | PRN
  Administered 2015-04-24: 10 mg via INTRAVENOUS

## 2015-04-24 MED ORDER — PROCHLORPERAZINE EDISYLATE 5 MG/ML IJ SOLN
10.0000 mg | Freq: Four times a day (QID) | INTRAMUSCULAR | Status: DC | PRN
  Administered 2015-04-24: 10 mg via INTRAVENOUS
  Filled 2015-04-24: qty 2

## 2015-04-24 MED ORDER — LIDOCAINE HCL (CARDIAC) 20 MG/ML IV SOLN
INTRAVENOUS | Status: AC
  Filled 2015-04-24: qty 5

## 2015-04-24 MED ORDER — GLYCOPYRROLATE 0.2 MG/ML IJ SOLN
INTRAMUSCULAR | Status: DC | PRN
  Administered 2015-04-24: 0.2 mg via INTRAVENOUS
  Administered 2015-04-24: 0.6 mg via INTRAVENOUS

## 2015-04-24 MED ORDER — LACTATED RINGERS IV SOLN
INTRAVENOUS | Status: DC
  Administered 2015-04-24 (×3): via INTRAVENOUS

## 2015-04-24 MED ORDER — SCOPOLAMINE 1 MG/3DAYS TD PT72
MEDICATED_PATCH | TRANSDERMAL | Status: AC
  Administered 2015-04-24: 1 via TRANSDERMAL
  Filled 2015-04-24: qty 1

## 2015-04-24 MED ORDER — BUPIVACAINE-EPINEPHRINE (PF) 0.25% -1:200000 IJ SOLN
INTRAMUSCULAR | Status: AC
Start: 2015-04-24 — End: 2015-04-24
  Filled 2015-04-24: qty 30

## 2015-04-24 MED ORDER — LIDOCAINE HCL (CARDIAC) 20 MG/ML IV SOLN
INTRAVENOUS | Status: DC | PRN
  Administered 2015-04-24: 80 mg via INTRAVENOUS

## 2015-04-24 MED ORDER — 0.9 % SODIUM CHLORIDE (POUR BTL) OPTIME
TOPICAL | Status: DC | PRN
  Administered 2015-04-24: 1000 mL

## 2015-04-24 MED ORDER — ROCURONIUM BROMIDE 100 MG/10ML IV SOLN
INTRAVENOUS | Status: DC | PRN
  Administered 2015-04-24: 10 mg via INTRAVENOUS
  Administered 2015-04-24: 20 mg via INTRAVENOUS
  Administered 2015-04-24: 10 mg via INTRAVENOUS

## 2015-04-24 MED ORDER — EPHEDRINE SULFATE 50 MG/ML IJ SOLN
INTRAMUSCULAR | Status: AC
  Filled 2015-04-24: qty 1

## 2015-04-24 MED ORDER — METOCLOPRAMIDE HCL 5 MG/ML IJ SOLN
INTRAMUSCULAR | Status: AC
  Filled 2015-04-24: qty 2

## 2015-04-24 MED ORDER — HYDROMORPHONE HCL 1 MG/ML IJ SOLN
INTRAMUSCULAR | Status: AC
  Administered 2015-04-24: 0.5 mg via INTRAVENOUS
  Filled 2015-04-24: qty 1

## 2015-04-24 MED ORDER — MEPERIDINE HCL 25 MG/ML IJ SOLN: 6.2500 mg | INTRAMUSCULAR | Status: DC | PRN

## 2015-04-24 MED ORDER — ONDANSETRON HCL 4 MG/2ML IJ SOLN
INTRAMUSCULAR | Status: DC | PRN
  Administered 2015-04-24: 4 mg via INTRAVENOUS

## 2015-04-24 MED ORDER — MIDAZOLAM HCL 2 MG/2ML IJ SOLN
INTRAMUSCULAR | Status: AC
  Filled 2015-04-24: qty 2

## 2015-04-24 MED ORDER — BUPIVACAINE-EPINEPHRINE 0.25% -1:200000 IJ SOLN
INTRAMUSCULAR | Status: DC | PRN
  Administered 2015-04-24: 11 mL

## 2015-04-24 MED ORDER — SODIUM CHLORIDE 0.9 % IR SOLN
Status: DC | PRN
  Administered 2015-04-24: 1000 mL

## 2015-04-24 MED ORDER — HYDROMORPHONE HCL 1 MG/ML IJ SOLN
0.2500 mg | INTRAMUSCULAR | Status: DC | PRN
  Administered 2015-04-24 (×4): 0.5 mg via INTRAVENOUS

## 2015-04-24 MED ORDER — NEOSTIGMINE METHYLSULFATE 10 MG/10ML IV SOLN
INTRAVENOUS | Status: DC | PRN
  Administered 2015-04-24: 1 mg via INTRAVENOUS
  Administered 2015-04-24: 4 mg via INTRAVENOUS

## 2015-04-24 MED ORDER — SUCCINYLCHOLINE CHLORIDE 20 MG/ML IJ SOLN
INTRAMUSCULAR | Status: AC
  Filled 2015-04-24: qty 1

## 2015-04-24 MED ORDER — DEXAMETHASONE SODIUM PHOSPHATE 10 MG/ML IJ SOLN
INTRAMUSCULAR | Status: AC
Start: 2015-04-24 — End: 2015-04-24
  Filled 2015-04-24: qty 1

## 2015-04-24 MED ORDER — PHENYLEPHRINE HCL 10 MG/ML IJ SOLN
INTRAMUSCULAR | Status: DC | PRN
  Administered 2015-04-24: 80 ug via INTRAVENOUS

## 2015-04-24 MED ORDER — FENTANYL CITRATE (PF) 250 MCG/5ML IJ SOLN
INTRAMUSCULAR | Status: AC
Start: 2015-04-24 — End: 2015-04-24
  Filled 2015-04-24: qty 5

## 2015-04-24 MED ORDER — FENTANYL CITRATE (PF) 250 MCG/5ML IJ SOLN
INTRAMUSCULAR | Status: AC
  Filled 2015-04-24: qty 5

## 2015-04-24 MED ORDER — ROCURONIUM BROMIDE 50 MG/5ML IV SOLN
INTRAVENOUS | Status: AC
  Filled 2015-04-24: qty 1

## 2015-04-24 MED ORDER — PHENYLEPHRINE 40 MCG/ML (10ML) SYRINGE FOR IV PUSH (FOR BLOOD PRESSURE SUPPORT)
PREFILLED_SYRINGE | INTRAVENOUS | Status: AC
Start: 2015-04-24 — End: 2015-04-24
  Filled 2015-04-24: qty 10

## 2015-04-24 MED ORDER — SODIUM CHLORIDE 0.9 % IV SOLN
INTRAVENOUS | Status: DC | PRN
  Administered 2015-04-24: 15 mL

## 2015-04-24 MED ORDER — SUCCINYLCHOLINE CHLORIDE 20 MG/ML IJ SOLN
INTRAMUSCULAR | Status: DC | PRN
  Administered 2015-04-24: 120 mg via INTRAVENOUS

## 2015-04-24 MED ORDER — PROPOFOL 10 MG/ML IV BOLUS
INTRAVENOUS | Status: DC | PRN
  Administered 2015-04-24: 200 mg via INTRAVENOUS

## 2015-04-24 SURGICAL SUPPLY — 47 items
APPLIER CLIP ROT 10 11.4 M/L (STAPLE) ×2
APR CLP MED LRG 11.4X10 (STAPLE) ×1
BAG SPEC RTRVL LRG 6X4 10 (ENDOMECHANICALS) ×1
BLADE SURG ROTATE 9660 (MISCELLANEOUS) IMPLANT
CANISTER SUCTION 2500CC (MISCELLANEOUS) ×2 IMPLANT
CHLORAPREP W/TINT 26ML (MISCELLANEOUS) ×2 IMPLANT
CLIP APPLIE ROT 10 11.4 M/L (STAPLE) ×1 IMPLANT
COVER MAYO STAND STRL (DRAPES) ×2 IMPLANT
COVER SURGICAL LIGHT HANDLE (MISCELLANEOUS) ×2 IMPLANT
DRAPE C-ARM 42X72 X-RAY (DRAPES) ×2 IMPLANT
DRAPE WARM FLUID 44X44 (DRAPE) ×2 IMPLANT
ELECT REM PT RETURN 9FT ADLT (ELECTROSURGICAL) ×2
ELECTRODE REM PT RTRN 9FT ADLT (ELECTROSURGICAL) ×1 IMPLANT
GLOVE BIO SURGEON STRL SZ8 (GLOVE) ×4 IMPLANT
GLOVE BIOGEL PI IND STRL 7.0 (GLOVE) IMPLANT
GLOVE BIOGEL PI IND STRL 7.5 (GLOVE) IMPLANT
GLOVE BIOGEL PI IND STRL 8 (GLOVE) ×1 IMPLANT
GLOVE BIOGEL PI IND STRL 8.5 (GLOVE) IMPLANT
GLOVE BIOGEL PI INDICATOR 7.0 (GLOVE) ×2
GLOVE BIOGEL PI INDICATOR 7.5 (GLOVE) ×1
GLOVE BIOGEL PI INDICATOR 8 (GLOVE) ×1
GLOVE BIOGEL PI INDICATOR 8.5 (GLOVE) ×1
GLOVE ECLIPSE 7.5 STRL STRAW (GLOVE) ×1 IMPLANT
GLOVE SURG SS PI 6.5 STRL IVOR (GLOVE) ×2 IMPLANT
GOWN STRL REUS W/ TWL LRG LVL3 (GOWN DISPOSABLE) ×2 IMPLANT
GOWN STRL REUS W/ TWL XL LVL3 (GOWN DISPOSABLE) ×1 IMPLANT
GOWN STRL REUS W/TWL LRG LVL3 (GOWN DISPOSABLE) ×4
GOWN STRL REUS W/TWL XL LVL3 (GOWN DISPOSABLE) ×2
KIT BASIN OR (CUSTOM PROCEDURE TRAY) ×2 IMPLANT
KIT ROOM TURNOVER OR (KITS) ×2 IMPLANT
LIQUID BAND (GAUZE/BANDAGES/DRESSINGS) ×2 IMPLANT
NS IRRIG 1000ML POUR BTL (IV SOLUTION) ×2 IMPLANT
PAD ARMBOARD 7.5X6 YLW CONV (MISCELLANEOUS) ×2 IMPLANT
POUCH SPECIMEN RETRIEVAL 10MM (ENDOMECHANICALS) ×2 IMPLANT
SCISSORS LAP 5X35 DISP (ENDOMECHANICALS) ×2 IMPLANT
SET CHOLANGIOGRAPH 5 50 .035 (SET/KITS/TRAYS/PACK) ×2 IMPLANT
SET IRRIG TUBING LAPAROSCOPIC (IRRIGATION / IRRIGATOR) ×2 IMPLANT
SLEEVE ENDOPATH XCEL 5M (ENDOMECHANICALS) ×2 IMPLANT
SPECIMEN JAR SMALL (MISCELLANEOUS) ×2 IMPLANT
SUT MNCRL AB 4-0 PS2 18 (SUTURE) ×2 IMPLANT
TOWEL OR 17X24 6PK STRL BLUE (TOWEL DISPOSABLE) ×1 IMPLANT
TOWEL OR 17X26 10 PK STRL BLUE (TOWEL DISPOSABLE) ×2 IMPLANT
TRAY LAPAROSCOPIC MC (CUSTOM PROCEDURE TRAY) ×2 IMPLANT
TROCAR XCEL BLUNT TIP 100MML (ENDOMECHANICALS) ×2 IMPLANT
TROCAR XCEL NON-BLD 11X100MML (ENDOMECHANICALS) ×2 IMPLANT
TROCAR XCEL NON-BLD 5MMX100MML (ENDOMECHANICALS) ×2 IMPLANT
TUBING INSUFFLATION (TUBING) ×2 IMPLANT

## 2015-04-24 NOTE — Transfer of Care (Signed)
Immediate Anesthesia Transfer of Care Note  Patient: Christina Parker  Procedure(s) Performed: Procedure(s): LAPAROSCOPIC CHOLECYSTECTOMY WITH INTRAOPERATIVE CHOLANGIOGRAM (N/A)  Patient Location: PACU  Anesthesia Type:General  Level of Consciousness: awake, alert  and oriented  Airway & Oxygen Therapy: Patient Spontanous Breathing and Patient connected to nasal cannula oxygen  Post-op Assessment: Report given to RN, Post -op Vital signs reviewed and stable and Patient moving all extremities X 4  Post vital signs: stable  Last Vitals:  Filed Vitals:   04/24/15 1343 04/24/15 1344  BP: 128/67   Pulse: 73   Temp:  36.6 C  Resp: 17     Complications: No apparent anesthesia complications

## 2015-04-24 NOTE — H&P (View-Only) (Signed)
Christina Parker 04/18/2015 8:54 AM Location: [facility] Patient #: [id number] DOB: 18-Jun-1975 Married / Language: English / Race: Black or African American Female  History of Present Illness Christina Moores A. Navy Belay MD; 04/18/2015 12:52 PM) Patient words: GB    Patient sent at the request of Dr. Sharlet Salina for gallbladder disease. He is a 3 month history of epigastric pain and nausea with bloating after eating. She was found to have gallstones 3 weeks ago on a routine ultrasound. There is no evidence of gallbladder wall thickening. She has watched her diet which helps her symptoms certainly has had a hard time with this. She said a history of reflux. Her pain locations in her epigastrium and right upper quadrant. It is made worse with eating and fatty greasy foods. Its made better with diet modification.  The patient is a 40 year old female.   Other Problems [name], CMA; 04/18/2015 8:55 AM) Asthma Back Pain Cholelithiasis Gastroesophageal Reflux Disease Heart murmur  Diagnostic Studies History [name], [place]; 04/18/2015 8:55 AM) Colonoscopy never Mammogram 1-3 years ago Pap Smear 1-5 years ago  Allergies [name], CMA; 04/18/2015 8:55 AM) No Known Drug Allergies 04/18/2015  Medication History [name], [place]; 04/18/2015 8:59 AM) Proventil HFA (108 (90 Base)MCG/ACT Aerosol Soln, Inhalation) Active. ZyrTEC Allergy (10MG Tablet, Oral) Active. Vitamin D (2000UNIT Tablet, Oral) Active. Flonase (50MCG/ACT Suspension, Nasal) Active. Multi Vitamin Daily (Oral) Active. Protonix (40MG Tablet DR, Oral) Active. Milk Thistle Active. Vitamin B12 (3000MCG/ML Liquid, Sublingual) Active. Fish Oil (1000MG Capsule, Oral) Active. CoQ-10 (100MG Capsule, Oral) Active. Vitamin E (1000UNIT Capsule, Oral) Active. Medications Reconciled  Social History [name], [place]; 04/18/2015 8:55 AM) Caffeine use Carbonated beverages,  Coffee, Tea. No alcohol use No drug use Tobacco use Never smoker.  Family History [name], [place]; 04/18/2015 8:55 AM) Diabetes Mellitus Father, Mother, Sister. Heart disease in female family member before age 68 Prostate Cancer Father. Thyroid problems Mother, Sister.  Pregnancy / Birth History [name], [place]; 04/18/2015 8:55 AM) Age at menarche 29 years. Gravida 3 Irregular periods Maternal age 88-20 Para 2     Review of Systems Christina Parker CMA; 04/18/2015 8:55 AM) General Present- Appetite Loss, Night Sweats and Weight Loss. Not Present- Chills, Fatigue, Fever and Weight Gain. HEENT Not Present- Earache, Hearing Loss, Hoarseness, Nose Bleed, Oral Ulcers, Ringing in the Ears, Seasonal Allergies, Sinus Pain, Sore Throat, Visual Disturbances, Wears glasses/contact lenses and Yellow Eyes. Respiratory Present- Chronic Cough. Not Present- Bloody sputum, Difficulty Breathing, Snoring and Wheezing. Breast Not Present- Breast Mass, Breast Pain, Nipple Discharge and Skin Changes. Cardiovascular Present- Difficulty Breathing Lying Down. Not Present- Chest Pain, Leg Cramps, Palpitations, Rapid Heart Rate, Shortness of Breath and Swelling of Extremities. Gastrointestinal Present- Abdominal Pain, Bloating, Change in Bowel Habits, Excessive gas, Indigestion and Nausea. Not Present- Bloody Stool, Chronic diarrhea, Constipation, Difficulty Swallowing, Gets full quickly at meals, Hemorrhoids, Rectal Pain and Vomiting. Female Genitourinary Not Present- Frequency, Nocturia, Painful Urination, Pelvic Pain and Urgency. Musculoskeletal Present- Back Pain. Not Present- Joint Pain, Joint Stiffness, Muscle Pain, Muscle Weakness and Swelling of Extremities. Neurological Not Present- Decreased Memory, Fainting, Headaches, Numbness, Seizures, Tingling, Tremor, Trouble walking and Weakness. Psychiatric Not Present- Anxiety, Bipolar, Change in Sleep Pattern, Depression, Fearful  and Frequent crying. Endocrine Not Present- Cold Intolerance, Excessive Hunger, Hair Changes, Heat Intolerance, Hot flashes and New Diabetes. Hematology Not Present- Easy Bruising, Excessive bleeding, Gland problems, HIV and Persistent Infections.  [employer] R. Parker CMA; 04/18/2015  8:55 AM) 04/18/2015 8:54 AM Weight: 214.5 lb Height: 61in Body Surface Area: 1.95 m Body Mass Index: 40.53 kg/m  BP: 118/78 (Sitting, Left Arm, Standard)      Physical Exam (Santi Troung A. Carolie Mcilrath MD; 04/18/2015 12:52 PM)  General Mental Status-Alert. General Appearance-Consistent with stated age. Hydration-Well hydrated. Voice-Normal.  Head and Neck Head-normocephalic, atraumatic with no lesions or palpable masses. Trachea-midline. Thyroid Gland Characteristics - normal size and consistency.  Chest and Lung Exam Chest and lung exam reveals -quiet, even and easy respiratory effort with no use of accessory muscles and on auscultation, normal breath sounds, no adventitious sounds and normal vocal resonance. Inspection Chest Wall - Normal. Back - normal.  Cardiovascular Cardiovascular examination reveals -normal heart sounds, regular rate and rhythm with no murmurs and normal pedal pulses bilaterally.  Abdomen Note: Minimal discomfort epigastrium to deep palpation. Negative Murphy sign.  Neurologic Neurologic evaluation reveals -alert and oriented x 3 with no impairment of recent or remote memory. Mental Status-Normal.  Musculoskeletal Normal Exam - Left-Upper Extremity Strength Normal and Lower Extremity Strength Normal. Normal Exam - Right-Upper Extremity Strength Normal and Lower Extremity Strength Normal.    Assessment & Plan (Rajeev Escue A. Stepheni Cameron MD; 04/18/2015 12:53 PM)  SYMPTOMATIC CHOLELITHIASIS (K80.20) Impression: Discussed medical and surgical options for treatment of gallbladder disease. Recommended laparoscopic cholecystectomy with cholangiogram.  The procedure has been discussed with the patient. Risks of laparoscopic cholecystectomy include bleeding, infection, bile duct injury, leak, death, open surgery, diarrhea, other surgery, organ injury, blood vessel injury, DVT, and additional care.  Current Plans You are being scheduled for surgery - Our schedulers will call you.  You should hear from our office's scheduling department within 5 working days about the location, date, and time of surgery. We try to make accommodations for patient's preferences in scheduling surgery, but sometimes the OR schedule or the surgeon's schedule prevents [place] from making those accommodations.  If you have not heard from our office [phone]) in 5 working days, call the office and ask for your surgeon's nurse.  If you have other questions about your diagnosis, plan, or surgery, call the office and ask for your surgeon's nurse.  The anatomy & physiology of hepatobiliary & pancreatic function was discussed. The pathophysiology of gallbladder dysfunction was discussed. Natural history risks without surgery was discussed. I feel the risks of no intervention will lead to serious problems that outweigh the operative risks; therefore, I recommended cholecystectomy to remove the pathology. I explained laparoscopic techniques with possible need for an open approach. Probable cholangiogram to evaluate the bilary tract was explained as well.  Risks such as bleeding, infection, abscess, leak, injury to other organs, need for further treatment, heart attack, death, and other risks were discussed. I noted a good likelihood this will help address the problem. Possibility that this will not correct all abdominal symptoms was explained. Goals of post-operative recovery were discussed as well. We will work to minimize complications. An educational handout further explaining the pathology and treatment options was given as well. Questions were answered. The patient expresses  understanding & wishes to proceed with surgery.  Pt Education - CCS Laparosopic Post Op HCI ([place])

## 2015-04-24 NOTE — Anesthesia Preprocedure Evaluation (Addendum)
Anesthesia Evaluation  Patient identified by MRN, date of birth, ID band Patient awake    Reviewed: Allergy & Precautions, NPO status , Patient's Chart, lab work & pertinent test results  Airway Mallampati: II  TM Distance: >3 FB Neck ROM: Full    Dental no notable dental hx. (+) Teeth Intact   Pulmonary shortness of breath and with exertion, asthma , pneumonia, resolved,    Pulmonary exam normal breath sounds clear to auscultation       Cardiovascular negative cardio ROS Normal cardiovascular exam Rhythm:Regular Rate:Normal     Neuro/Psych negative neurological ROS  negative psych ROS   GI/Hepatic Neg liver ROS, GERD  Medicated and Controlled,Cholelithiasis- symptomatic   Endo/Other  Morbid obesity  Renal/GU negative Renal ROS  negative genitourinary   Musculoskeletal negative musculoskeletal ROS (+)   Abdominal (+) + obese,   Peds  Hematology  (+) anemia ,   Anesthesia Other Findings   Reproductive/Obstetrics                            Anesthesia Physical Anesthesia Plan  ASA: III  Anesthesia Plan: General   Post-op Pain Management:    Induction: Intravenous, Rapid sequence and Cricoid pressure planned  Airway Management Planned: Oral ETT  Additional Equipment:   Intra-op Plan:   Post-operative Plan: Extubation in OR  Informed Consent: I have reviewed the patients History and Physical, chart, labs and discussed the procedure including the risks, benefits and alternatives for the proposed anesthesia with the patient or authorized representative who has indicated his/her understanding and acceptance.   Dental advisory given  Plan Discussed with: CRNA, Anesthesiologist and Surgeon  Anesthesia Plan Comments:         Anesthesia Quick Evaluation

## 2015-04-24 NOTE — Interval H&P Note (Signed)
History and Physical Interval Note:  04/24/2015 10:31 AM  Christina Parker  has presented today for surgery, with the diagnosis of gallstones  The various methods of treatment have been discussed with the patient and family. After consideration of risks, benefits and other options for treatment, the patient has consented to  Procedure(s): LAPAROSCOPIC CHOLECYSTECTOMY WITH INTRAOPERATIVE CHOLANGIOGRAM (N/A) as a surgical intervention .  The patient's history has been reviewed, patient examined, no change in status, stable for surgery.  I have reviewed the patient's chart and labs.  Questions were answered to the patient's satisfaction.     Chequita Mofield A.

## 2015-04-24 NOTE — Discharge Instructions (Signed)
[facility] ______[facility] °LAPAROSCOPIC SURGERY: POST OP INSTRUCTIONS °Always review your discharge instruction sheet given to you by the facility where your surgery was performed. °IF YOU HAVE DISABILITY OR FAMILY LEAVE FORMS, YOU MUST BRING THEM TO THE OFFICE FOR PROCESSING.   °DO NOT GIVE THEM TO YOUR DOCTOR. ° °1. A prescription for pain medication may be given to you upon discharge.  Take your pain medication as prescribed, if needed.  If narcotic pain medicine is not needed, then you may take acetaminophen (Tylenol) or ibuprofen (Advil) as needed. °2. Take your usually prescribed medications unless otherwise directed. °3. If you need a refill on your pain medication, please contact your pharmacy.  They will contact our office to request authorization. Prescriptions will not be filled after 5pm or on week-ends. °4. You should follow a light diet the first few days after arrival home, such as soup and crackers, etc.  Be sure to include lots of fluids daily. °5. Most patients will experience some swelling and bruising in the area of the incisions.  Ice packs will help.  Swelling and bruising can take several days to resolve.  °6. It is common to experience some constipation if taking pain medication after surgery.  Increasing fluid intake and taking a stool softener (such as Colace) will usually help or prevent this problem from occurring.  A mild laxative (Milk of Magnesia or Miralax) should be taken according to package instructions if there are no bowel movements after 48 hours. °7. Unless discharge instructions indicate otherwise, you may remove your bandages 24-48 hours after surgery, and you may shower at that time.  You may have steri-strips (small skin tapes) in place directly over the incision.  These strips should be left on the skin for 7-10 days.  If your surgeon used skin glue on the incision, you may shower in 24 hours.  The glue will flake off over the next 2-3 weeks.  Any sutures or  staples will be removed at the office during your follow-up visit. °8. ACTIVITIES:  You may resume regular (light) daily activities beginning the next day--such as daily self-care, walking, climbing stairs--gradually increasing activities as tolerated.  You may have sexual intercourse when it is comfortable.  Refrain from any heavy lifting or straining until approved by your doctor. °a. You may drive when you are no longer taking prescription pain medication, you can comfortably wear a seatbelt, and you can safely maneuver your car and apply brakes. °b. RETURN TO WORK:  __________________________________________________________ °9. You should see your doctor in the office for a follow-up appointment approximately 2-3 weeks after your surgery.  Make sure that you call for this appointment within a day or two after you arrive home to insure a convenient appointment time. °10. OTHER INSTRUCTIONS: __________________________________________________________________________________________________________________________ __________________________________________________________________________________________________________________________ °WHEN TO CALL YOUR DOCTOR: °1. Fever over 101.0 °2. Inability to urinate °3. Continued bleeding from incision. °4. Increased pain, redness, or drainage from the incision. °5. Increasing abdominal pain ° °The clinic staff is available to answer your questions during regular business hours.  Please don’t hesitate to call and ask to speak to one of the nurses for clinical concerns.  If you have a medical emergency, go to the nearest emergency room or call 911.  A surgeon from [facility] is always on call at the hospital. °[address], [address], [place], [place]  [address] ? [address] [address], [place], [place]   [address] °[phone] ? [phone] ? FAX [phone] °Web site:   [email or web address] °

## 2015-04-24 NOTE — Anesthesia Procedure Notes (Signed)
Procedure Name: Intubation Date/Time: 04/24/2015 12:07 PM Performed by: Rejeana Brock L Pre-anesthesia Checklist: Patient identified, Emergency Drugs available, Suction available and Patient being monitored Patient Re-evaluated:Patient Re-evaluated prior to inductionOxygen Delivery Method: Circle System Utilized Preoxygenation: Pre-oxygenation with 100% oxygen Intubation Type: IV induction Ventilation: Mask ventilation without difficulty Laryngoscope Size: Mac and 3 Tube type: Oral Tube size: 7.5 mm Number of attempts: 1 Airway Equipment and Method: Stylet and Oral airway Placement Confirmation: ETT inserted through vocal cords under direct vision,  positive ETCO2 and breath sounds checked- equal and bilateral Secured at: 22 cm Tube secured with: Tape Dental Injury: Teeth and Oropharynx as per pre-operative assessment

## 2015-04-24 NOTE — Op Note (Signed)
Laparoscopic Cholecystectomy with IOC Procedure Note  Indications: This patient presents with symptomatic gallbladder disease and will undergo laparoscopic cholecystectomy. The procedure has been discussed with the patient. Operative and non operative treatments have been discussed. Risks of surgery include bleeding, infection,  Common bile duct injury,  Injury to the stomach,liver, colon,small intestine, abdominal wall,  Diaphragm,  Major blood vessels,  And the need for an open procedure.  Other risks include worsening of medical problems, death,  DVT and pulmonary embolism, and cardiovascular events.   Medical options have also been discussed. The patient has been informed of long term expectations of surgery and non surgical options,  The patient agrees to proceed.    Pre-operative Diagnosis: Calculus of gallbladder without mention of cholecystitis or obstruction  Post-operative Diagnosis: Same  Surgeon: Desiree Daise A.   Assistants: Hitchcock RNFA   Anesthesia: General endotracheal anesthesia and Local anesthesia 0.25.% bupivacaine, with epinephrine  ASA Class: 2  Procedure Details  The patient was seen again in the Holding Room. The risks, benefits, complications, treatment options, and expected outcomes were discussed with the patient. The possibilities of reaction to medication, pulmonary aspiration, perforation of viscus, bleeding, recurrent infection, finding a normal gallbladder, the need for additional procedures, failure to diagnose a condition, the possible need to convert to an open procedure, and creating a complication requiring transfusion or operation were discussed with the patient. The patient and/or family concurred with the proposed plan, giving informed consent. The site of surgery properly noted/marked. The patient was taken to Operating Room, identified as Christina Parker and the procedure verified as Laparoscopic Cholecystectomy with Intraoperative Cholangiograms. A  Time Out was held and the above information confirmed.  Prior to the induction of general anesthesia, antibiotic prophylaxis was administered. General endotracheal anesthesia was then administered and tolerated well. After the induction, the abdomen was prepped in the usual sterile fashion. The patient was positioned in the supine position with the left arm comfortably tucked, along with some reverse Trendelenburg.  Local anesthetic agent was injected into the skin near the umbilicus and an incision made. The midline fascia was incised and the Hasson technique was used to introduce a 12 mm port under direct vision. It was secured with a figure of eight Vicryl suture placed in the usual fashion. Pneumoperitoneum was then created with CO2 and tolerated well without any adverse changes in the patient's vital signs. Additional trocars were introduced under direct vision with an 11 mm trocar in the epigastrium and 2 5 mm trocars in the right upper quadrant. All skin incisions were infiltrated with a local anesthetic agent before making the incision and placing the trocars.   The gallbladder was identified, the fundus grasped and retracted cephalad. Adhesions were lysed bluntly and with the electrocautery where indicated, taking care not to injure any adjacent organs or viscus. The infundibulum was grasped and retracted laterally, exposing the peritoneum overlying the triangle of Calot. This was then divided and exposed in a blunt fashion. The cystic duct was clearly identified and bluntly dissected circumferentially. The junctions of the gallbladder, cystic duct and common bile duct were clearly identified prior to the division of any linear structure.   An incision was made in the cystic duct and the cholangiogram catheter introduced. The catheter was secured using an endoclip. The study showed no stones and good visualization of the distal and proximal biliary tree. The catheter was then removed.   The cystic  duct was then  ligated with surgical clips  on the patient  side and  clipped on the gallbladder side and divided. The cystic artery was identified, dissected free, ligated with clips and divided as well. Posterior cystic artery clipped and divided.  The gallbladder was dissected from the liver bed in retrograde fashion with the electrocautery. The gallbladder was removed with endocatch bag. . The liver bed was irrigated and inspected. Hemostasis was achieved with the electrocautery. Copious irrigation was utilized and was repeatedly aspirated until clear all particulate matter. Hemostasis was achieved with no signs  Of bleeding or bile leakage.  Pneumoperitoneum was completely reduced after viewing removal of the trocars under direct vision. The wound was thoroughly irrigated and the fascia was then closed with a figure of eight suture; the skin was then closed with 4 O monocryl and a sterile dressing was applied.  Instrument, sponge, and needle counts were correct at closure and at the conclusion of the case.   Findings: Cholelithiasis  Estimated Blood Loss: less than 50 mL                  Total IV Fluids: 600 mL         Specimens: Gallbladder           Complications: None; patient tolerated the procedure well.         Disposition: PACU - hemodynamically stable.         Condition: stable

## 2015-04-25 ENCOUNTER — Encounter (HOSPITAL_COMMUNITY): Payer: Self-pay | Admitting: Surgery

## 2015-04-25 NOTE — Anesthesia Postprocedure Evaluation (Signed)
Anesthesia Post Note  Patient: Christina Parker  Procedure(s) Performed: Procedure(s) (LRB): LAPAROSCOPIC CHOLECYSTECTOMY WITH INTRAOPERATIVE CHOLANGIOGRAM (N/A)  Patient location during evaluation: PACU Anesthesia Type: General Level of consciousness: awake and alert and oriented Pain management: pain level controlled Vital Signs Assessment: post-procedure vital signs reviewed and stable Respiratory status: spontaneous breathing, nonlabored ventilation and respiratory function stable Cardiovascular status: blood pressure returned to baseline and stable Postop Assessment: no signs of nausea or vomiting Anesthetic complications: no    Last Vitals:  Filed Vitals:   04/24/15 1413 04/24/15 1428  BP: 121/74 118/71  Pulse: 74 82  Temp:    Resp: 9 16    Last Pain:  Filed Vitals:   04/24/15 1443  PainSc: 6                  Arlander Gillen A.

## 2015-08-01 ENCOUNTER — Ambulatory Visit: Payer: Federal, State, Local not specified - PPO | Admitting: Internal Medicine

## 2015-09-01 ENCOUNTER — Other Ambulatory Visit (INDEPENDENT_AMBULATORY_CARE_PROVIDER_SITE_OTHER): Payer: Federal, State, Local not specified - PPO

## 2015-09-01 ENCOUNTER — Ambulatory Visit (INDEPENDENT_AMBULATORY_CARE_PROVIDER_SITE_OTHER): Payer: Federal, State, Local not specified - PPO | Admitting: Family

## 2015-09-01 ENCOUNTER — Encounter: Payer: Self-pay | Admitting: Family

## 2015-09-01 DIAGNOSIS — M2550 Pain in unspecified joint: Secondary | ICD-10-CM | POA: Diagnosis not present

## 2015-09-01 DIAGNOSIS — R768 Other specified abnormal immunological findings in serum: Secondary | ICD-10-CM

## 2015-09-01 LAB — CK: Total CK: 70 U/L (ref 7–177)

## 2015-09-01 LAB — RHEUMATOID FACTOR

## 2015-09-01 NOTE — Assessment & Plan Note (Signed)
Arthralgias with concern for possible overtraining as well as obesity contributing to her current symptoms. Obtain creatinine kinase, ANA, rheumatod factor, Lyme disease IGM and Rocky mountain spotted fever. Treat conservatively with OTC anti-inflammatories as needed for discomfort and recommend recovery time. Evaluate nutrition using MyFitnessPal to determine adequate caloric intake. Follow up pending blood work or if symptoms do not improve.

## 2015-09-01 NOTE — Patient Instructions (Addendum)
Thank you for choosing [facility].  Summary/Instructions:  Please continue to take your supplements.  Please stop by the lab on the lower level of the building for your blood work. Your results will be released to [place] (or called to you) after review, usually within 72 hours after test completion. If any changes need to be made, you will be notified at that same time.  1. The lab is open from 7:30am to 5:30 pm Monday-Friday  2. No appointment is necessary  3. Fasting (if needed) is 6-8 hours after food and drink; black  coffee and water are okay   If your symptoms worsen or fail to improve, please contact our office for further instruction, or in case of emergency go directly to the emergency room at the closest medical facility.

## 2015-09-01 NOTE — Progress Notes (Signed)
Subjective:    Patient ID: Christina Parker, female    DOB: [birth year], 40 y.o.   MRN: [id number]  Chief Complaint  Patient presents with  . Joint Pain    joint pain and very fatigue, joints all over body are aching    HPI:  LASANDRA Parker is a 40 y.o. female who  has a past medical history of Anemia; Asthma; Constipation; Family history of anesthesia complication; GERD (gastroesophageal reflux disease); Heart murmur; History of vitamin B deficiency; History of vitamin D deficiency; MVP (mitral valve prolapse); Pneumonia; Shortness of breath dyspnea; and Vaginal delivery (1997, 2005). and presents today for an office visit.   This is a new problem. Associated symptoms of arthralgias and fatigue hss been going on for 2-3 months. Describes that she and her husband have been working out more including working with trampoline work and dancing.  Pain is located in multiple joints including her knees, elbows and shoulders on bilaterally sides. Aggravated by additional exercise. Works out 4 days per week with different exercises per day. Has not been outside or working in the garden. Denies changes in urine color. No fevers. Modifying factors include glucosamine, vitamins and warm soaks in her tub.   No Known Allergies   Current Outpatient Prescriptions on File Prior to Visit  Medication Sig Dispense Refill  . albuterol (PROVENTIL HFA;VENTOLIN HFA) 108 (90 BASE) MCG/ACT inhaler Inhale 2 puffs into the lungs every 4 (four) hours as needed for wheezing or shortness of breath (cough, shortness of breath or wheezing.). 1 Inhaler 1  . cetirizine (ZYRTEC ALLERGY) 10 MG tablet Take 10 mg by mouth daily.    . Cholecalciferol (VITAMIN D) 2000 UNITS tablet Take 2,000 Units by mouth daily.    . Coenzyme Q10 (COQ-10) 100 MG CAPS Take 100 mg by mouth daily.    . Cyanocobalamin (B-12) 3000 MCG CAPS Take 3,000 mg by mouth daily.    . fluticasone (FLONASE) 50 MCG/ACT nasal spray Place 2 sprays into both  nostrils daily. 16 g 2  . milk thistle 175 MG tablet Take 175 mg by mouth daily.    . Multiple Vitamins-Minerals (MULTIVITAMIN WITH MINERALS) tablet Take 1 tablet by mouth daily. Organic with co enzymes and probiotic    . Omega-3 Fatty Acids (FISH OIL) 1000 MG CAPS Take 1,000 mg by mouth 2 (two) times daily.    . ondansetron (ZOFRAN ODT) 4 MG disintegrating tablet Take 1 tablet (4 mg total) by mouth every 8 (eight) hours as needed for nausea or vomiting. 20 tablet 0  . oxyCODONE-acetaminophen (ROXICET) 5-325 MG tablet Take 1-2 tablets by mouth every 4 (four) hours as needed. 30 tablet 0  . pantoprazole (PROTONIX) 40 MG tablet Take 1 tablet (40 mg total) by mouth 2 (two) times daily before a meal. 60 tablet 3  . vitamin E 1000 UNIT capsule Take 1,000 Units by mouth daily.     No current facility-administered medications on file prior to visit.     Past Medical History:  Diagnosis Date  . Anemia   . Asthma    exercise induction and seonal allerggies casuse flars  . Constipation   . Family history of anesthesia complication    pt's daughter has PO N&V  . GERD (gastroesophageal reflux disease)   . Heart murmur    MVP  . History of vitamin B deficiency   . History of vitamin D deficiency   . MVP (mitral valve prolapse)   . Pneumonia   . Shortness  of breath dyspnea    with abdominal bloating since Jan 2017- "gallbladder"  . Vaginal delivery 1997, 2005     Past Surgical History:  Procedure Laterality Date  . CHOLECYSTECTOMY N/A 04/24/2015   Procedure: LAPAROSCOPIC CHOLECYSTECTOMY WITH INTRAOPERATIVE CHOLANGIOGRAM;  Surgeon: Erroll Luna, MD;  Location: [facility];  Service: General;  Laterality: N/A;  . DILITATION & CURRETTAGE/HYSTROSCOPY WITH NOVASURE ABLATION N/A 08/02/2013   Procedure: DILATATION & CURETTAGE/HYSTEROSCOPY WITH NOVASURE ABLATION;  Surgeon: Marylynn Pearson, MD;  Location: [facility];  Service: Gynecology;  Laterality: N/A;  . TUBAL LIGATION  2005    Review of Systems    Constitutional: Negative for chills and fever.  Respiratory: Negative for chest tightness and shortness of breath.   Cardiovascular: Negative for chest pain, palpitations and leg swelling.  Musculoskeletal: Positive for arthralgias and myalgias. Negative for joint swelling.  Neurological: Negative for dizziness and weakness.      Objective:    BP 120/84 (BP Location: Left Arm, Patient Position: Sitting, Cuff Size: Large)   Pulse 89   Temp 98.8 F (37.1 C) (Oral)   Resp 16   Ht 5' 2"  (1.575 m)   Wt 222 lb 12.8 oz (101.1 kg)   SpO2 98%   BMI 40.75 kg/m  Nursing note and vital signs reviewed.   Physical Exam  Constitutional: She is oriented to person, place, and time. She appears well-developed and well-nourished. No distress.  Cardiovascular: Normal rate, regular rhythm, normal heart sounds and intact distal pulses.   Pulmonary/Chest: Effort normal and breath sounds normal.  Musculoskeletal:  Upper/lower extremities with no obvious deformity, discoloration, or edema noted. Range of motion is all within normal limits. Strength is 4-5+. Pulses are intact and appropriate. No signs of significant injury noted.  Neurological: She is alert and oriented to person, place, and time.  Skin: Skin is warm and dry.  Psychiatric: She has a normal mood and affect. Her behavior is normal. Judgment and thought content normal.       Assessment & Plan:   Problem List Items Addressed This Visit      Other   Arthralgia    Arthralgias with concern for possible overtraining as well as obesity contributing to her current symptoms. Obtain creatinine kinase, ANA, rheumatod factor, Lyme disease IGM and Rocky mountain spotted fever. Treat conservatively with OTC anti-inflammatories as needed for discomfort and recommend recovery time. Evaluate nutrition using MyFitnessPal to determine adequate caloric intake. Follow up pending blood work or if symptoms do not improve.       Relevant Orders   Lyme  Disease, IgM, Early Test w/ Rflx   Rocky mtn spotted fvr ab, IgG-blood   CK (Creatine Kinase) (Completed)   Rheumatoid factor (Completed)   ANA (Completed)    Other Visit Diagnoses   None.      I am having Ms. Diguglielmo maintain her multivitamin with minerals, Vitamin D, albuterol, cetirizine, fluticasone, pantoprazole, milk thistle, CoQ-10, B-12, Fish Oil, vitamin E, oxyCODONE-acetaminophen, ondansetron, and TURMERIC PO.   Meds ordered this encounter  Medications  . TURMERIC PO    Sig: Take 400 mg by mouth.     Follow-up: Return if symptoms worsen or fail to improve.  Mauricio Po, FNP

## 2015-09-02 ENCOUNTER — Encounter: Payer: Self-pay | Admitting: Family

## 2015-09-02 LAB — ANA: Anti Nuclear Antibody(ANA): POSITIVE — AB

## 2015-09-02 LAB — ANTI-NUCLEAR AB-TITER (ANA TITER): ANA Titer 1: 1:80 {titer} — ABNORMAL HIGH

## 2015-09-03 ENCOUNTER — Other Ambulatory Visit (INDEPENDENT_AMBULATORY_CARE_PROVIDER_SITE_OTHER): Payer: Federal, State, Local not specified - PPO

## 2015-09-03 ENCOUNTER — Encounter: Payer: Self-pay | Admitting: Internal Medicine

## 2015-09-03 ENCOUNTER — Ambulatory Visit (INDEPENDENT_AMBULATORY_CARE_PROVIDER_SITE_OTHER): Payer: Federal, State, Local not specified - PPO | Admitting: Internal Medicine

## 2015-09-03 VITALS — BP 110/80 | HR 74 | Temp 98.8°F | Resp 16 | Ht 64.0 in | Wt 221.0 lb

## 2015-09-03 DIAGNOSIS — Z0000 Encounter for general adult medical examination without abnormal findings: Secondary | ICD-10-CM

## 2015-09-03 DIAGNOSIS — J4520 Mild intermittent asthma, uncomplicated: Secondary | ICD-10-CM

## 2015-09-03 DIAGNOSIS — K219 Gastro-esophageal reflux disease without esophagitis: Secondary | ICD-10-CM

## 2015-09-03 LAB — VITAMIN D 25 HYDROXY (VIT D DEFICIENCY, FRACTURES): VITD: 51.56 ng/mL (ref 30.00–100.00)

## 2015-09-03 LAB — COMPREHENSIVE METABOLIC PANEL
ALT: 19 U/L (ref 0–35)
AST: 15 U/L (ref 0–37)
Albumin: 3.9 g/dL (ref 3.5–5.2)
Alkaline Phosphatase: 77 U/L (ref 39–117)
BILIRUBIN TOTAL: 0.4 mg/dL (ref 0.2–1.2)
BUN: 9 mg/dL (ref 6–23)
CHLORIDE: 104 meq/L (ref 96–112)
CO2: 30 meq/L (ref 19–32)
CREATININE: 0.74 mg/dL (ref 0.40–1.20)
Calcium: 9.4 mg/dL (ref 8.4–10.5)
GFR: 112.31 mL/min (ref >60.00)
GLUCOSE: 93 mg/dL (ref 70–99)
Potassium: 3.7 mEq/L (ref 3.5–5.1)
SODIUM: 141 meq/L (ref 135–145)
Total Protein: 7.9 g/dL (ref 6.0–8.3)

## 2015-09-03 LAB — LIPID PANEL
CHOL/HDL RATIO: 4
Cholesterol: 132 mg/dL (ref 0–200)
HDL: 34.3 mg/dL — ABNORMAL LOW (ref >39.00)
LDL CALC: 80 mg/dL (ref 0–99)
NONHDL: 97.98
TRIGLYCERIDES: 89 mg/dL (ref 0.0–149.0)
VLDL: 17.8 mg/dL (ref 0.0–40.0)

## 2015-09-03 LAB — T4, FREE: FREE T4: 0.86 ng/dL (ref 0.60–1.60)

## 2015-09-03 LAB — CBC
HCT: 39.5 % (ref 36.0–46.0)
Hemoglobin: 13.1 g/dL (ref 12.0–15.0)
MCHC: 33.3 g/dL (ref 30.0–36.0)
MCV: 81.4 fl (ref 78.0–100.0)
PLATELETS: 336 10*3/uL (ref 150.0–400.0)
RBC: 4.85 Mil/uL (ref 3.87–5.11)
RDW: 14.5 % (ref 11.5–15.5)
WBC: 7.9 10*3/uL (ref 4.0–10.5)

## 2015-09-03 LAB — TSH: TSH: 1.87 u[IU]/mL (ref 0.35–4.50)

## 2015-09-03 LAB — VITAMIN B12: VITAMIN B 12: 1193 pg/mL — AB (ref 211–911)

## 2015-09-03 LAB — ROCKY MTN SPOTTED FVR AB, IGG-BLOOD: RMSF IGG: NEGATIVE

## 2015-09-03 LAB — LYME, IGM, EARLY TEST/REFLEX: LYME DISEASE AB, QUANT, IGM: <0.8 index (ref 0.00–0.79)

## 2015-09-03 MED ORDER — PANTOPRAZOLE SODIUM 40 MG PO TBEC: 40.0000 mg | DELAYED_RELEASE_TABLET | Freq: Every day | ORAL | 11 refills | Status: DC

## 2015-09-03 NOTE — Progress Notes (Signed)
Pre visit review using our clinic review tool, if applicable. No additional management support is needed unless otherwise documented below in the visit note. 

## 2015-09-03 NOTE — Assessment & Plan Note (Signed)
Trying to lose weight but not have much success. Exercising 4 times per week.

## 2015-09-03 NOTE — Patient Instructions (Signed)
We are checking the labs today and will call you back with the results.   Go ahead and see the rheumatologist to make sure that the ana test is a false positive.   Health Maintenance, Female Adopting a healthy lifestyle and getting preventive care can go a long way to promote health and wellness. Talk with your health care provider about what schedule of regular examinations is right for you. This is a good chance for you to check in with your provider about disease prevention and staying healthy. In between checkups, there are plenty of things you can do on your own. Experts have done a lot of research about which lifestyle changes and preventive measures are most likely to keep you healthy. Ask your health care provider for more information. WEIGHT AND DIET  Eat a healthy diet  Be sure to include plenty of vegetables, fruits, low-fat dairy products, and lean protein.  Do not eat a lot of foods high in solid fats, added sugars, or salt.  Get regular exercise. This is one of the most important things you can do for your health.  Most adults should exercise for at least 150 minutes each week. The exercise should increase your heart rate and make you sweat (moderate-intensity exercise).  Most adults should also do strengthening exercises at least twice a week. This is in addition to the moderate-intensity exercise.  Maintain a healthy weight  Body mass index (BMI) is a measurement that can be used to identify possible weight problems. It estimates body fat based on height and weight. Your health care provider can help determine your BMI and help you achieve or maintain a healthy weight.  For females 70 years of age and older:   A BMI below 18.5 is considered underweight.  A BMI of 18.5 to 24.9 is normal.  A BMI of 25 to 29.9 is considered overweight.  A BMI of 30 and above is considered obese.  Watch levels of cholesterol and blood lipids  You should start having your blood tested  for lipids and cholesterol at 39 years of age, then have this test every 5 years.  You may need to have your cholesterol levels checked more often if:  Your lipid or cholesterol levels are high.  You are older than 39 years of age.  You are at high risk for heart disease.  CANCER SCREENING   Lung Cancer  Lung cancer screening is recommended for adults 4-61 years old who are at high risk for lung cancer because of a history of smoking.  A yearly low-dose CT scan of the lungs is recommended for people who:  Currently smoke.  Have quit within the past 15 years.  Have at least a 30-pack-year history of smoking. A pack year is smoking an average of one pack of cigarettes a day for 1 year.  Yearly screening should continue until it has been 15 years since you quit.  Yearly screening should stop if you develop a health problem that would prevent you from having lung cancer treatment.  Breast Cancer  Practice breast self-awareness. This means understanding how your breasts normally appear and feel.  It also means doing regular breast self-exams. Let your health care provider know about any changes, no matter how small.  If you are in your 20s or 30s, you should have a clinical breast exam (CBE) by a health care provider every 1-3 years as part of a regular health exam.  If you are 40 or older, have  a CBE every year. Also consider having a breast X-ray (mammogram) every year.  If you have a family history of breast cancer, talk to your health care provider about genetic screening.  If you are at high risk for breast cancer, talk to your health care provider about having an MRI and a mammogram every year.  Breast cancer gene (BRCA) assessment is recommended for women who have family members with BRCA-related cancers. BRCA-related cancers include:  Breast.  Ovarian.  Tubal.  Peritoneal cancers.  Results of the assessment will determine the need for genetic counseling and  BRCA1 and BRCA2 testing. Cervical Cancer Your health care provider may recommend that you be screened regularly for cancer of the pelvic organs (ovaries, uterus, and vagina). This screening involves a pelvic examination, including checking for microscopic changes to the surface of your cervix (Pap test). You may be encouraged to have this screening done every 3 years, beginning at age 29.  For women ages 11-65, health care providers may recommend pelvic exams and Pap testing every 3 years, or they may recommend the Pap and pelvic exam, combined with testing for human papilloma virus (HPV), every 5 years. Some types of HPV increase your risk of cervical cancer. Testing for HPV may also be done on women of any age with unclear Pap test results.  Other health care providers may not recommend any screening for nonpregnant women who are considered low risk for pelvic cancer and who do not have symptoms. Ask your health care provider if a screening pelvic exam is right for you.  If you have had past treatment for cervical cancer or a condition that could lead to cancer, you need Pap tests and screening for cancer for at least 20 years after your treatment. If Pap tests have been discontinued, your risk factors (such as having a new sexual partner) need to be reassessed to determine if screening should resume. Some women have medical problems that increase the chance of getting cervical cancer. In these cases, your health care provider may recommend more frequent screening and Pap tests. Colorectal Cancer  This type of cancer can be detected and often prevented.  Routine colorectal cancer screening usually begins at 39 years of age and continues through 39 years of age.  Your health care provider may recommend screening at an earlier age if you have risk factors for colon cancer.  Your health care provider may also recommend using home test kits to check for hidden blood in the stool.  A small camera at  the end of a tube can be used to examine your colon directly (sigmoidoscopy or colonoscopy). This is done to check for the earliest forms of colorectal cancer.  Routine screening usually begins at age 26.  Direct examination of the colon should be repeated every 5-10 years through 39 years of age. However, you may need to be screened more often if early forms of precancerous polyps or small growths are found. Skin Cancer  Check your skin from head to toe regularly.  Tell your health care provider about any new moles or changes in moles, especially if there is a change in a mole's shape or color.  Also tell your health care provider if you have a mole that is larger than the size of a pencil eraser.  Always use sunscreen. Apply sunscreen liberally and repeatedly throughout the day.  Protect yourself by wearing long sleeves, pants, a wide-brimmed hat, and sunglasses whenever you are outside. HEART DISEASE, DIABETES, AND HIGH  BLOOD PRESSURE   High blood pressure causes heart disease and increases the risk of stroke. High blood pressure is more likely to develop in:  People who have blood pressure in the high end of the normal range (130-139/85-89 mm Hg).  People who are overweight or obese.  People who are African American.  If you are 18-39 years of age, have your blood pressure checked every 3-5 years. If you are 40 years of age or older, have your blood pressure checked every year. You should have your blood pressure measured twice--once when you are at a hospital or clinic, and once when you are not at a hospital or clinic. Record the average of the two measurements. To check your blood pressure when you are not at a hospital or clinic, you can use:  An automated blood pressure machine at a pharmacy.  A home blood pressure monitor.  If you are between 55 years and 79 years old, ask your health care provider if you should take aspirin to prevent strokes.  Have regular diabetes  screenings. This involves taking a blood sample to check your fasting blood sugar level.  If you are at a normal weight and have a low risk for diabetes, have this test once every three years after 39 years of age.  If you are overweight and have a high risk for diabetes, consider being tested at a younger age or more often. PREVENTING INFECTION  Hepatitis B  If you have a higher risk for hepatitis B, you should be screened for this virus. You are considered at high risk for hepatitis B if:  You were born in a country where hepatitis B is common. Ask your health care provider which countries are considered high risk.  Your parents were born in a high-risk country, and you have not been immunized against hepatitis B (hepatitis B vaccine).  You have HIV or AIDS.  You use needles to inject street drugs.  You live with someone who has hepatitis B.  You have had sex with someone who has hepatitis B.  You get hemodialysis treatment.  You take certain medicines for conditions, including cancer, organ transplantation, and autoimmune conditions. Hepatitis C  Blood testing is recommended for:  Everyone born from 1945 through 1965.  Anyone with known risk factors for hepatitis C. Sexually transmitted infections (STIs)  You should be screened for sexually transmitted infections (STIs) including gonorrhea and chlamydia if:  You are sexually active and are younger than 39 years of age.  You are older than 39 years of age and your health care provider tells you that you are at risk for this type of infection.  Your sexual activity has changed since you were last screened and you are at an increased risk for chlamydia or gonorrhea. Ask your health care provider if you are at risk.  If you do not have HIV, but are at risk, it may be recommended that you take a prescription medicine daily to prevent HIV infection. This is called pre-exposure prophylaxis (PrEP). You are considered at risk  if:  You are sexually active and do not regularly use condoms or know the HIV status of your partner(s).  You take drugs by injection.  You are sexually active with a partner who has HIV. Talk with your health care provider about whether you are at high risk of being infected with HIV. If you choose to begin PrEP, you should first be tested for HIV. You should then be tested every   3 months for as long as you are taking PrEP.  PREGNANCY   If you are premenopausal and you may become pregnant, ask your health care provider about preconception counseling.  If you may become pregnant, take 400 to 800 micrograms (mcg) of folic acid every day.  If you want to prevent pregnancy, talk to your health care provider about birth control (contraception). OSTEOPOROSIS AND MENOPAUSE   Osteoporosis is a disease in which the bones lose minerals and strength with aging. This can result in serious bone fractures. Your risk for osteoporosis can be identified using a bone density scan.  If you are 55 years of age or older, or if you are at risk for osteoporosis and fractures, ask your health care provider if you should be screened.  Ask your health care provider whether you should take a calcium or vitamin D supplement to lower your risk for osteoporosis.  Menopause may have certain physical symptoms and risks.  Hormone replacement therapy may reduce some of these symptoms and risks. Talk to your health care provider about whether hormone replacement therapy is right for you.  HOME CARE INSTRUCTIONS   Schedule regular health, dental, and eye exams.  Stay current with your immunizations.   Do not use any tobacco products including cigarettes, chewing tobacco, or electronic cigarettes.  If you are pregnant, do not drink alcohol.  If you are breastfeeding, limit how much and how often you drink alcohol.  Limit alcohol intake to no more than 1 drink per day for nonpregnant women. One drink equals 12  ounces of beer, 5 ounces of wine, or 1 ounces of hard liquor.  Do not use street drugs.  Do not share needles.  Ask your health care provider for help if you need support or information about quitting drugs.  Tell your health care provider if you often feel depressed.  Tell your health care provider if you have ever been abused or do not feel safe at home.   This information is not intended to replace advice given to you by your health care provider. Make sure you discuss any questions you have with your health care provider.   Document Released: 07/27/2010 Document Revised: 02/01/2014 Document Reviewed: 12/13/2012 Elsevier Interactive Patient Education [employer].

## 2015-09-03 NOTE — Assessment & Plan Note (Signed)
Pap smear up to date. No indication for early colonoscopy. Checking labs today. Tdap up to date and gets flu shot at her work. Counseled on the dangers of distracted driving. Not smoking and exercising. Given screening recommendations.

## 2015-09-03 NOTE — Assessment & Plan Note (Signed)
Taking protonix some days but knows which foods to avoid.

## 2015-09-03 NOTE — Assessment & Plan Note (Signed)
No flare today and not using albuterol inhaler except rare occasions.

## 2015-09-03 NOTE — Progress Notes (Signed)
   Subjective:    Patient ID: Christina Parker, female    DOB: 1975/04/12, 40 y.o.   MRN: [id number]  HPI The patient is a 40 YO female coming in for wellness. She is concerned that she is not able to lose weight. She is working out more and not having any luck. She is waiting for rheumatology about her positive ANA test.   PMH, [facility], social history reviewed and updated.   Review of Systems  Constitutional: Negative for activity change, appetite change, fatigue, fever and unexpected weight change.  HENT: Negative for ear discharge, ear pain, sinus pressure, sore throat and trouble swallowing.   Eyes: Negative.   Respiratory: Negative for cough, chest tightness, shortness of breath and wheezing.   Cardiovascular: Negative for chest pain, palpitations and leg swelling.  Gastrointestinal: Negative for abdominal distention, abdominal pain, constipation, diarrhea and nausea.  Musculoskeletal: Positive for arthralgias and myalgias. Negative for back pain, gait problem and joint swelling.  Skin: Negative.   Neurological: Negative.   Psychiatric/Behavioral: Negative.       Objective:   Physical Exam  Constitutional: She is oriented to person, place, and time. She appears well-developed and well-nourished.  Overweight  HENT:  Head: Normocephalic and atraumatic.  Eyes: EOM are normal.  Neck: Normal range of motion.  Cardiovascular: Normal rate and regular rhythm.   Pulmonary/Chest: Effort normal. No respiratory distress. She has no wheezes.  Abdominal: Soft. Bowel sounds are normal. She exhibits no distension. There is no tenderness. There is no rebound.  Musculoskeletal: She exhibits no edema.  Neurological: She is alert and oriented to person, place, and time.  Skin: Skin is warm and dry.  Psychiatric: She has a normal mood and affect.   Vitals:   09/03/15 0801  BP: 110/80  Pulse: 74  Resp: 16  Temp: 98.8 F (37.1 C)  TempSrc: Oral  SpO2: 94%  Weight: 221 lb (100.2 kg)  Height:  5' 4"  (1.626 m)      Assessment & Plan:

## 2015-09-04 ENCOUNTER — Telehealth: Payer: Self-pay

## 2015-09-04 ENCOUNTER — Encounter: Payer: Self-pay | Admitting: Internal Medicine

## 2015-09-04 NOTE — Telephone Encounter (Signed)
That is fine 

## 2015-09-04 NOTE — Telephone Encounter (Signed)
Fine with me

## 2015-09-04 NOTE — Telephone Encounter (Signed)
Called patient and LVM to make a tranfer app.

## 2015-09-04 NOTE — Telephone Encounter (Signed)
Patient called and is requesting to switch to [facility]. She states that is closer to where she lives.  Dr. Sharlet Salina is that ok with you? Dr. Lorelei Pont is that ok with you?

## 2015-09-08 ENCOUNTER — Encounter: Payer: Self-pay | Admitting: Family

## 2015-10-20 DIAGNOSIS — M2550 Pain in unspecified joint: Secondary | ICD-10-CM | POA: Diagnosis not present

## 2015-10-20 DIAGNOSIS — R768 Other specified abnormal immunological findings in serum: Secondary | ICD-10-CM | POA: Diagnosis not present

## 2015-11-20 DIAGNOSIS — R21 Rash and other nonspecific skin eruption: Secondary | ICD-10-CM | POA: Diagnosis not present

## 2015-11-20 DIAGNOSIS — M2550 Pain in unspecified joint: Secondary | ICD-10-CM | POA: Diagnosis not present

## 2015-11-20 DIAGNOSIS — R768 Other specified abnormal immunological findings in serum: Secondary | ICD-10-CM | POA: Diagnosis not present

## 2015-11-25 ENCOUNTER — Other Ambulatory Visit: Payer: Self-pay | Admitting: Nurse Practitioner

## 2016-01-08 DIAGNOSIS — L700 Acne vulgaris: Secondary | ICD-10-CM | POA: Diagnosis not present

## 2016-01-08 DIAGNOSIS — L218 Other seborrheic dermatitis: Secondary | ICD-10-CM | POA: Diagnosis not present

## 2016-01-21 DIAGNOSIS — L700 Acne vulgaris: Secondary | ICD-10-CM | POA: Diagnosis not present

## 2016-01-21 DIAGNOSIS — L219 Seborrheic dermatitis, unspecified: Secondary | ICD-10-CM | POA: Diagnosis not present

## 2016-02-10 DIAGNOSIS — Z01419 Encounter for gynecological examination (general) (routine) without abnormal findings: Secondary | ICD-10-CM | POA: Diagnosis not present

## 2016-02-10 DIAGNOSIS — Z6839 Body mass index (BMI) 39.0-39.9, adult: Secondary | ICD-10-CM | POA: Diagnosis not present

## 2016-02-23 DIAGNOSIS — M2550 Pain in unspecified joint: Secondary | ICD-10-CM | POA: Diagnosis not present

## 2016-02-23 DIAGNOSIS — R21 Rash and other nonspecific skin eruption: Secondary | ICD-10-CM | POA: Diagnosis not present

## 2016-03-04 ENCOUNTER — Ambulatory Visit (INDEPENDENT_AMBULATORY_CARE_PROVIDER_SITE_OTHER): Payer: Federal, State, Local not specified - PPO | Admitting: Behavioral Health

## 2016-03-04 DIAGNOSIS — Z23 Encounter for immunization: Secondary | ICD-10-CM

## 2016-06-23 DIAGNOSIS — R21 Rash and other nonspecific skin eruption: Secondary | ICD-10-CM | POA: Diagnosis not present

## 2016-06-23 DIAGNOSIS — M2550 Pain in unspecified joint: Secondary | ICD-10-CM | POA: Diagnosis not present

## 2016-07-07 ENCOUNTER — Telehealth: Payer: Self-pay | Admitting: Internal Medicine

## 2016-07-07 NOTE — Telephone Encounter (Signed)
Ok with me 

## 2016-07-07 NOTE — Telephone Encounter (Signed)
Patient request to transfer care from [facility] to Dr. Lorelei Pont

## 2016-08-04 NOTE — Progress Notes (Signed)
[facility] [address], [place], [place] [address] [phone]  Date:  08/05/2016   Name:  Christina Parker   DOB:  1975-08-04   MRN:  [id number]  PCP:  Hoyt Koch, MD    Chief Complaint: Transfer of Care (Former pt of Dr. Sharlet Salina. )   History of Present Illness:  Christina Parker is a 41 y.o. very pleasant female patient who presents with the following:  New patient to my practice today History of obesity and GERD, she also had a lap chole last year She does have to watch her diet now and does not eat new foods unless she is at home due to risk of diarrhea. 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  We think that she has psoriasis in her scalp.   She is not on any medications as this time except prn celebrex.   She has some joint pain but is able to  She is s/p BTL and had an ablation as well.    She is not fasting today- last ate a few hours ago Her maternal grandmother died very recently- funeral was today.  She had kidney failure and was on dialysis, and then fell and had a subdural.    Patient Active Problem List   Diagnosis Date Noted  . Psoriatic arthritis ([place]) 08/05/2016  . Routine general medical examination at a health care facility 09/03/2015  . Arthralgia 09/01/2015  . GERD (gastroesophageal reflux disease) 05/24/2014  . Morbid obesity ([place]) 05/24/2014  . MVP (mitral valve prolapse) 01/16/2014  . Reactive airway disease 12/07/2013     Past Medical History:  Diagnosis Date  . Anemia   . Asthma    exercise induction and seonal allerggies casuse flars  . Constipation   . Family history of anesthesia complication    pt's daughter has PO N&V  . GERD (gastroesophageal reflux disease)   . Heart murmur    MVP  . History of vitamin B deficiency   . History of vitamin D deficiency   . MVP (mitral valve prolapse)   . Pneumonia   . Shortness of breath dyspnea    with abdominal bloating since Jan 2017- "gallbladder"  . Vaginal delivery 1997, 2005    Past Surgical History:  Procedure Laterality Date  . CHOLECYSTECTOMY N/A 04/24/2015   Procedure: LAPAROSCOPIC CHOLECYSTECTOMY WITH INTRAOPERATIVE CHOLANGIOGRAM;  Surgeon: Erroll Luna, MD;  Location: [facility];  Service: General;  Laterality: N/A;  . DILITATION & CURRETTAGE/HYSTROSCOPY WITH NOVASURE ABLATION N/A 08/02/2013   Procedure: DILATATION & CURETTAGE/HYSTEROSCOPY WITH NOVASURE ABLATION;  Surgeon: Marylynn Pearson, MD;  Location: [facility];  Service: Gynecology;  Laterality: N/A;  . TUBAL LIGATION  2005    Social History  Substance Use Topics  . Smoking status: Passive Smoke Exposure - Never Smoker  . Smokeless tobacco: Never Used     Comment: Exposed x 11 years. Pt  works in home health  . Alcohol use No    Family History  Problem Relation Age of Onset  . Diabetes Sister   . Diabetes Maternal Grandmother   . Hypertension Maternal Grandmother   . Hypertension Maternal Grandfather   . Diabetes Paternal Grandmother   . Hypertension Paternal Grandmother   . Diabetes Paternal Grandfather   . Hypertension Paternal Grandfather   . Asthma Son   . Allergies Mother   . Allergies Son     No Known Allergies  Medication list has been reviewed and updated.  Current Outpatient Prescriptions on File Prior to Visit  Medication Sig Dispense Refill  . albuterol (PROVENTIL HFA;VENTOLIN HFA) 108 (90 BASE) MCG/ACT inhaler Inhale 2 puffs into the lungs every 4 (four) hours as  needed for wheezing or shortness of breath (cough, shortness of breath or wheezing.). 1 Inhaler 1  . cetirizine (ZYRTEC ALLERGY) 10 MG tablet Take 10 mg by mouth daily.    . Cholecalciferol (VITAMIN D) 2000 UNITS tablet Take 2,000 Units by mouth daily.    . Coenzyme Q10 (COQ-10) 100 MG CAPS Take 100 mg by mouth daily.    . Cyanocobalamin (B-12) 3000 MCG CAPS Take 3,000 mg by mouth daily.    . fluticasone (FLONASE) 50 MCG/ACT nasal spray Place 2 sprays into both nostrils daily. 16 g 2  . milk thistle 175 MG tablet Take 175 mg by mouth daily.    . Multiple Vitamins-Minerals (MULTIVITAMIN WITH MINERALS) tablet Take 1 tablet by mouth daily. Organic with co enzymes and probiotic    . Omega-3 Fatty Acids (FISH OIL) 1000 MG CAPS Take 1,000 mg by mouth 2 (two) times daily.    . ondansetron (ZOFRAN ODT) 4 MG disintegrating tablet Take 1 tablet (4 mg total) by mouth every 8 (eight) hours as needed for nausea or vomiting. 20 tablet 0  . pantoprazole (PROTONIX) 40 MG tablet Take 1 tablet (40 mg total) by mouth daily. 30 tablet 11  . TURMERIC PO Take 400 mg by mouth.    . vitamin E 1000 UNIT capsule Take 1,000 Units by mouth daily.     No current facility-administered medications on file prior to visit.     Review of Systems:  As per HPI- otherwise negative. No fever or chills, no CP or SOB   Physical Examination: Vitals:   08/05/16 1711  BP: 112/82  Pulse: 85  Temp: 99.2 F (37.3 C)   Vitals:   08/05/16 1711  Weight: 234 lb 3.2 oz (106.2 kg)  Height: 5' 4"  (1.626 m)   Body mass index is 40.2 kg/m. Ideal Body Weight: Weight in (lb) to have BMI = 25: 145.3  GEN: WDWN, NAD, Non-toxic, A & O x 3, obese, otherwise looks well HEENT: Atraumatic, Normocephalic. Neck supple. No masses, No LAD. Ears and Nose: No external deformity. CV: RRR, No M/G/R. No JVD. No thrill. No extra heart sounds. PULM: CTA B, no wheezes, crackles, rhonchi. No retractions. No resp. distress. No accessory muscle  use. ABD: S, NT, ND, +BS. No rebound. No HSM. EXTR: No c/c/e NEURO Normal gait.  PSYCH: Normally interactive. Conversant. Not depressed or anxious appearing.  Calm demeanor.   She has lost weigh from a high of 250 lbs Assessment and Plan: Gastroesophageal reflux disease, esophagitis presence not specified  Psoriatic arthritis ([place])  Screening for diabetes mellitus - Plan: Comprehensive metabolic panel, Hemoglobin A1c  Screening for hyperlipidemia - Plan: Lipid panel  Screening for deficiency anemia - Plan: CBC  Screening for  tuberculosis - Plan: Quantiferon tb gold assay  Here today to establish care Ordered future labs for her today She plans to come in for a CPE soon   Signed Lamar Blinks, MD

## 2016-08-05 ENCOUNTER — Ambulatory Visit (INDEPENDENT_AMBULATORY_CARE_PROVIDER_SITE_OTHER): Payer: Federal, State, Local not specified - PPO | Admitting: Family Medicine

## 2016-08-05 VITALS — BP 112/82 | HR 85 | Temp 99.2°F | Ht 64.0 in | Wt 234.2 lb

## 2016-08-05 DIAGNOSIS — Z13220 Encounter for screening for lipoid disorders: Secondary | ICD-10-CM | POA: Diagnosis not present

## 2016-08-05 DIAGNOSIS — Z131 Encounter for screening for diabetes mellitus: Secondary | ICD-10-CM

## 2016-08-05 DIAGNOSIS — Z130 Encounter for screening for diseases of the blood and blood-forming organs and certain disorders involving the immune mechanism: Secondary | ICD-10-CM

## 2016-08-05 DIAGNOSIS — K219 Gastro-esophageal reflux disease without esophagitis: Secondary | ICD-10-CM | POA: Diagnosis not present

## 2016-08-05 DIAGNOSIS — Z111 Encounter for screening for respiratory tuberculosis: Secondary | ICD-10-CM | POA: Diagnosis not present

## 2016-08-05 DIAGNOSIS — L4050 Arthropathic psoriasis, unspecified: Secondary | ICD-10-CM

## 2016-08-05 DIAGNOSIS — M069 Rheumatoid arthritis, unspecified: Secondary | ICD-10-CM | POA: Insufficient documentation

## 2016-08-05 NOTE — Patient Instructions (Signed)
Great to meet you today! Please come in for fasting labs at your convenience and I will be in touch with your results asap

## 2016-08-06 ENCOUNTER — Telehealth: Payer: Self-pay | Admitting: Family Medicine

## 2016-08-06 NOTE — Telephone Encounter (Signed)
Pt dropped off copies of lab results done at other office for provider to have on pt's chart. (3 pages). Document put at front office tray.

## 2016-08-12 ENCOUNTER — Encounter: Payer: Self-pay | Admitting: Family Medicine

## 2016-08-12 NOTE — Progress Notes (Unsigned)
C-Reactive Protein, quant  46.8 (H) Sedimentation Rate- Westergren  62 (H)

## 2016-08-16 ENCOUNTER — Other Ambulatory Visit (INDEPENDENT_AMBULATORY_CARE_PROVIDER_SITE_OTHER): Payer: Federal, State, Local not specified - PPO

## 2016-08-16 ENCOUNTER — Telehealth: Payer: Self-pay | Admitting: Family Medicine

## 2016-08-16 DIAGNOSIS — Z130 Encounter for screening for diseases of the blood and blood-forming organs and certain disorders involving the immune mechanism: Secondary | ICD-10-CM

## 2016-08-16 DIAGNOSIS — Z13220 Encounter for screening for lipoid disorders: Secondary | ICD-10-CM

## 2016-08-16 DIAGNOSIS — Z111 Encounter for screening for respiratory tuberculosis: Secondary | ICD-10-CM | POA: Diagnosis not present

## 2016-08-16 DIAGNOSIS — Z131 Encounter for screening for diabetes mellitus: Secondary | ICD-10-CM

## 2016-08-16 LAB — LIPID PANEL
Cholesterol: 135 mg/dL (ref 0–200)
HDL: 34.9 mg/dL — AB (ref >39.00)
LDL CALC: 78 mg/dL (ref 0–99)
NonHDL: 100.28
TRIGLYCERIDES: 112 mg/dL (ref 0.0–149.0)
Total CHOL/HDL Ratio: 4
VLDL: 22.4 mg/dL (ref 0.0–40.0)

## 2016-08-16 LAB — COMPREHENSIVE METABOLIC PANEL
ALBUMIN: 3.7 g/dL (ref 3.5–5.2)
ALT: 22 U/L (ref 0–35)
AST: 22 U/L (ref 0–37)
Alkaline Phosphatase: 64 U/L (ref 39–117)
BUN: 10 mg/dL (ref 6–23)
CALCIUM: 9.4 mg/dL (ref 8.4–10.5)
CHLORIDE: 104 meq/L (ref 96–112)
CO2: 26 meq/L (ref 19–32)
Creatinine, Ser: 0.71 mg/dL (ref 0.40–1.20)
GFR: 117.22 mL/min (ref >60.00)
Glucose, Bld: 121 mg/dL — ABNORMAL HIGH (ref 70–99)
POTASSIUM: 4 meq/L (ref 3.5–5.1)
Sodium: 138 mEq/L (ref 135–145)
Total Bilirubin: 0.4 mg/dL (ref 0.2–1.2)
Total Protein: 7.4 g/dL (ref 6.0–8.3)

## 2016-08-16 LAB — CBC
HEMATOCRIT: 40 % (ref 36.0–46.0)
Hemoglobin: 13 g/dL (ref 12.0–15.0)
MCHC: 32.6 g/dL (ref 30.0–36.0)
MCV: 82.5 fl (ref 78.0–100.0)
PLATELETS: 356 10*3/uL (ref 150.0–400.0)
RBC: 4.84 Mil/uL (ref 3.87–5.11)
RDW: 15 % (ref 11.5–15.5)
WBC: 7.4 10*3/uL (ref 4.0–10.5)

## 2016-08-16 LAB — HEMOGLOBIN A1C: HEMOGLOBIN A1C: 6.2 % (ref 4.6–6.5)

## 2016-08-16 NOTE — Telephone Encounter (Signed)
Pt dropped off waiver form for Dr. Lorelei Pont to fill out, pt will pick up when ready, documents placed in tray at front office/gd

## 2016-08-18 ENCOUNTER — Encounter: Payer: Self-pay | Admitting: Family Medicine

## 2016-08-18 LAB — QUANTIFERON TB GOLD ASSAY (BLOOD)
INTERFERON GAMMA RELEASE ASSAY: NEGATIVE
MITOGEN-NIL SO: 8.11 [IU]/mL
QUANTIFERON TB AG MINUS NIL: 0 [IU]/mL
Quantiferon Nil Value: 0.05 IU/mL

## 2016-08-19 ENCOUNTER — Encounter: Payer: Self-pay | Admitting: Family Medicine

## 2016-09-14 NOTE — Progress Notes (Signed)
[facility] [place], [place],  [address] [phone]  Date:  09/16/2016   Name:  Christina Parker   DOB:  03/21/1975   MRN:  [id number]  PCP:  Darreld Mclean, MD    Chief Complaint: Annual Exam (not fasting)   History of Present Illness:  Christina Parker is a 41 y.o. very pleasant female patient who presents with the following:  Here today for a CPE I saw her as a new patient just over a month ago History of obesity and GERD, she also had a lap chole last year She does have to watch her diet now and does not eat new foods unless she is at home due to risk of diarrhea. 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  We think that she has psoriasis in her scalp.   She is not on any medications as this time except prn celebrex.   She has some joint pain but is able to  She is s/p BTL and had an ablation as well.    We did full labs for her last month- all ok except her HDL was slightly low and A1c in pre-diabetes range She is taking a fish oil to raise her HDL GYN care: she goes to Dr. Orvan Seen, her last visit was about a year ago.   She has had one mammogram she plans to have one done soon   She was noted to have pre-diabetes on her recent labs She is working on meal prepping, she is trying to exercise some They are cutting down on carbs, and increasing protein and vegetables   Wt Readings from Last 3 Encounters:  09/16/16 231 lb 6.4 oz (105  kg)  08/05/16 234 lb 3.2 oz (106.2 kg)  09/03/15 221 lb (100.2 kg)   She has already lost a few lbs through her changes as above Patient Active Problem List   Diagnosis Date Noted  . Psoriatic arthritis ([place]) 08/05/2016  . Routine general medical examination at a health care facility 09/03/2015  . Arthralgia 09/01/2015  . GERD (gastroesophageal reflux disease) 05/24/2014  . Morbid obesity ([place]) 05/24/2014  . MVP (mitral valve prolapse) 01/16/2014  . Reactive airway disease 12/07/2013    Past Medical History:  Diagnosis Date  . Anemia   . Asthma    exercise induction and seonal allerggies casuse flars  . Constipation   . Family history of anesthesia complication    pt's daughter has PO N&V  . GERD (gastroesophageal reflux disease)   . Heart murmur    MVP  . History of vitamin B deficiency   . History of vitamin D deficiency   . MVP (mitral valve prolapse)   . Pneumonia   . Shortness of breath dyspnea    with abdominal bloating since Jan 2017- "gallbladder"  . Vaginal delivery 1997, 2005    Past  Surgical History:  Procedure Laterality Date  . CHOLECYSTECTOMY N/A 04/24/2015   Procedure: LAPAROSCOPIC CHOLECYSTECTOMY WITH INTRAOPERATIVE CHOLANGIOGRAM;  Surgeon: Erroll Luna, MD;  Location: [facility];  Service: General;  Laterality: N/A;  . DILITATION & CURRETTAGE/HYSTROSCOPY WITH NOVASURE ABLATION N/A 08/02/2013   Procedure: DILATATION & CURETTAGE/HYSTEROSCOPY WITH NOVASURE ABLATION;  Surgeon: Marylynn Pearson, MD;  Location: [facility];  Service: Gynecology;  Laterality: N/A;  . TUBAL LIGATION  2005    Social History  Substance Use Topics  . Smoking status: Passive Smoke Exposure - Never Smoker  . Smokeless tobacco: Never Used     Comment: Exposed x 11 years. Pt works in home health  . Alcohol use No    Family History  Problem Relation Age of Onset  . Diabetes Sister   . Diabetes Maternal Grandmother   . Hypertension Maternal Grandmother   . Hypertension Maternal  Grandfather   . Diabetes Paternal Grandmother   . Hypertension Paternal Grandmother   . Diabetes Paternal Grandfather   . Hypertension Paternal Grandfather   . Asthma Son   . Allergies Mother   . Allergies Son     No Known Allergies  Medication list has been reviewed and updated.  Current Outpatient Prescriptions on File Prior to Visit  Medication Sig Dispense Refill  . albuterol (PROVENTIL HFA;VENTOLIN HFA) 108 (90 BASE) MCG/ACT inhaler Inhale 2 puffs into the lungs every 4 (four) hours as needed for wheezing or shortness of breath (cough, shortness of breath or wheezing.). 1 Inhaler 1  . celecoxib (CELEBREX) 200 MG capsule Take 1 capsule by mouth 2 (two) times daily.  2  . cetirizine (ZYRTEC ALLERGY) 10 MG tablet Take 10 mg by mouth daily.    . Cholecalciferol (VITAMIN D) 2000 UNITS tablet Take 2,000 Units by mouth daily.    Marland Kitchen CINNAMON PO Take 1 tablet by mouth daily.    . Coenzyme Q10 (COQ-10) 100 MG CAPS Take 100 mg by mouth daily.    . Cyanocobalamin (B-12) 3000 MCG CAPS Take 3,000 mg by mouth daily.    Marland Kitchen FIBER PO Take 1 tablet by mouth daily.    . fluticasone (FLONASE) 50 MCG/ACT nasal spray Place 2 sprays into both nostrils daily. 16 g 2  . milk thistle 175 MG tablet Take 175 mg by mouth daily.    . Multiple Vitamins-Minerals (MULTIVITAMIN WITH MINERALS) tablet Take 1 tablet by mouth daily. Organic with co enzymes and probiotic    . Omega-3 Fatty Acids (FISH OIL) 1000 MG CAPS Take 1,000 mg by mouth 2 (two) times daily.    . ondansetron (ZOFRAN ODT) 4 MG disintegrating tablet Take 1 tablet (4 mg total) by mouth every 8 (eight) hours as needed for nausea or vomiting. 20 tablet 0  . pantoprazole (PROTONIX) 40 MG tablet Take 1 tablet (40 mg total) by mouth daily. 30 tablet 11  . Polyethylene Glycol 3350 (MIRALAX PO) Take by mouth as needed.    . TURMERIC PO Take 400 mg by mouth.    . vitamin E 1000 UNIT capsule Take 1,000 Units by mouth daily.     No current facility-administered  medications on file prior to visit.     Review of Systems:  As per HPI- otherwise negative. When exercising she does not have any CP or SOB  Physical Examination: Vitals:   09/16/16 1616  BP: 117/71  Pulse: 96  Temp: 98.4 F (36.9 C)  SpO2: 100%   Vitals:   09/16/16 1616  Weight: 231 lb 6.4 oz (105  kg)  Height: 5' 4"  (1.626 m)   Body mass index is 39.72 kg/m. Ideal Body Weight: Weight in (lb) to have BMI = 25: 145.3  GEN: WDWN, NAD, Non-toxic, A & O x 3, obese, looks well HEENT: Atraumatic, Normocephalic. Neck supple. No masses, No LAD.  Bilateral TM wnl, oropharynx normal.  PEERL,EOMI.   Ears and Nose: No external deformity. CV: RRR, No M/G/R. No JVD. No thrill. No extra heart sounds. PULM: CTA B, no wheezes, crackles, rhonchi. No retractions. No resp. distress. No accessory muscle use. ABD: S, NT, ND, +BS. No rebound. No HSM. EXTR: No c/c/e NEURO Normal gait.  PSYCH: Normally interactive. Conversant. Not depressed or anxious appearing.  Calm demeanor.    Assessment and Plan: Physical exam  Pre-diabetes - Plan: Hemoglobin A1c  Here today for a CPE We just saw her to establish care and did labs for her recently Went over her labs in person today She is working to improve her weight, cholesterol and glucose control  Flu shot today She will do a mammo soon   Lab on 08/16/2016  Component Date Value Ref Range Status  . WBC 08/16/2016 7.4  4.0 - 10.5 K/uL Final  . RBC 08/16/2016 4.84  3.87 - 5.11 Mil/uL Final  . Platelets 08/16/2016 356.0  150.0 - 400.0 K/uL Final  . Hemoglobin 08/16/2016 13.0  12.0 - 15.0 g/dL Final  . HCT 08/16/2016 40.0  36.0 - 46.0 % Final  . MCV 08/16/2016 82.5  78.0 - 100.0 fl Final  . MCHC 08/16/2016 32.6  30.0 - 36.0 g/dL Final  . RDW 08/16/2016 15.0  11.5 - 15.5 % Final  . Sodium 08/16/2016 138  135 - 145 mEq/L Final  . Potassium 08/16/2016 4.0  3.5 - 5.1 mEq/L Final  . Chloride 08/16/2016 104  96 - 112 mEq/L Final  . CO2 08/16/2016  26  19 - 32 mEq/L Final  . Glucose, Bld 08/16/2016 121* 70 - 99 mg/dL Final  . BUN 08/16/2016 10  6 - 23 mg/dL Final  . Creatinine, Ser 08/16/2016 0.71  0.40 - 1.20 mg/dL Final  . Total Bilirubin 08/16/2016 0.4  0.2 - 1.2 mg/dL Final  . Alkaline Phosphatase 08/16/2016 64  39 - 117 U/L Final  . AST 08/16/2016 22  0 - 37 U/L Final  . ALT 08/16/2016 22  0 - 35 U/L Final  . Total Protein 08/16/2016 7.4  6.0 - 8.3 g/dL Final  . Albumin 08/16/2016 3.7  3.5 - 5.2 g/dL Final  . Calcium 08/16/2016 9.4  8.4 - 10.5 mg/dL Final  . GFR 08/16/2016 117.22  >60.00 mL/min Final  . Hgb A1c MFr Bld 08/16/2016 6.2  4.6 - 6.5 % Final   Glycemic Control Guidelines for People with Diabetes:Non Diabetic:  <6%Goal of Therapy: <7%Additional Action Suggested:  >8%   . Cholesterol 08/16/2016 135  0 - 200 mg/dL Final   ATP III Classification       Desirable:  < 200 mg/dL               Borderline High:  200 - 239 mg/dL          High:  > = 240 mg/dL  . Triglycerides 08/16/2016 112.0  0.0 - 149.0 mg/dL Final   Normal:  <150 mg/dLBorderline High:  150 - 199 mg/dL  . HDL 08/16/2016 34.90* >39.00 mg/dL Final  . VLDL 08/16/2016 22.4  0.0 - 40.0 mg/dL Final  . LDL Cholesterol 08/16/2016 78  0 - 99 mg/dL  Final  . Total CHOL/HDL Ratio 08/16/2016 4   Final                  Men          Women1/2 Average Risk     3.4          3.3Average Risk          5.0          4.42X Average Risk          9.6          7.13X Average Risk          15.0          11.0                      . NonHDL 08/16/2016 100.28   Final   NOTE:  Non-HDL goal should be 30 mg/dL higher than patient's LDL goal (i.e. LDL goal of < 70 mg/dL, would have non-HDL goal of < 100 mg/dL)  . Interferon Gamma Release Assay 08/16/2016 NEGATIVE  NEGATIVE Final   Negative test result. M. tuberculosis complex infection unlikely.  . Quantiferon Nil Value 08/16/2016 0.05  IU/mL Final  . Mitogen-Nil 08/16/2016 8.11  IU/mL Final  . Quantiferon Tb Ag Minus Nil Value 08/16/2016 0.00   IU/mL Final   Comment:   The Nil tube value is used to determine if the patient has a preexisting immune response which could cause a false-positive reading on the test. In order for a test to be valid, the Nil tube must have a value of less than or equal to 8.0 IU/mL.   The mitogen control tube is used to assure the patient has a healthy immune status and also serves as a control for correct blood handling and incubation. It is used to detect false-negative readings. The mitogen tube must have a gamma interferon value of greater than or equal to 0.5 IU/mL higher than the value of the Nil tube.   The TB antigen tube is coated with the M. tuberculosis specific antigens. For a test to be considered positive, the TB antigen tube value minus the Nil tube value must be greater than or equal to 0.35 IU/mL.   For additional information, please refer to http://education.questdiagnostics.com/faq/QFT (This link is being provided for informational/educational purposes only.)    She does have pre-diabetes and is working on this already   Bixby, MD

## 2016-09-16 ENCOUNTER — Encounter: Payer: Self-pay | Admitting: Family Medicine

## 2016-09-16 ENCOUNTER — Ambulatory Visit (INDEPENDENT_AMBULATORY_CARE_PROVIDER_SITE_OTHER): Payer: Federal, State, Local not specified - PPO | Admitting: Family Medicine

## 2016-09-16 VITALS — BP 117/71 | HR 96 | Temp 98.4°F | Ht 64.0 in | Wt 231.4 lb

## 2016-09-16 DIAGNOSIS — Z23 Encounter for immunization: Secondary | ICD-10-CM | POA: Diagnosis not present

## 2016-09-16 DIAGNOSIS — Z0000 Encounter for general adult medical examination without abnormal findings: Secondary | ICD-10-CM

## 2016-09-16 DIAGNOSIS — R7303 Prediabetes: Secondary | ICD-10-CM | POA: Diagnosis not present

## 2016-09-16 NOTE — Addendum Note (Signed)
Addended by: Roma Kayser on: 09/16/2016 04:43 PM   Modules accepted: Orders, SmartSet

## 2016-09-16 NOTE — Patient Instructions (Signed)
It was a pleasure to see you today!  Great job on your healthy changes so far Continue to work on diet and exercise with a goal of gradual weight loss  Please come in for a lab visit only in a few months and we can repeat your A1c (maybe aim for 6 months) You got your flu shot today Take care and be sure to repeat your mammogram soon  Health Maintenance, Female Adopting a healthy lifestyle and getting preventive care can go a long way to promote health and wellness. Talk with your health care provider about what schedule of regular examinations is right for you. This is a good chance for you to check in with your provider about disease prevention and staying healthy. In between checkups, there are plenty of things you can do on your own. Experts have done a lot of research about which lifestyle changes and preventive measures are most likely to keep you healthy. Ask your health care provider for more information. Weight and diet Eat a healthy diet  Be sure to include plenty of vegetables, fruits, low-fat dairy products, and lean protein.  Do not eat a lot of foods high in solid fats, added sugars, or salt.  Get regular exercise. This is one of the most important things you can do for your health. ? Most adults should exercise for at least 150 minutes each week. The exercise should increase your heart rate and make you sweat (moderate-intensity exercise). ? Most adults should also do strengthening exercises at least twice a week. This is in addition to the moderate-intensity exercise.  Maintain a healthy weight  Body mass index (BMI) is a measurement that can be used to identify possible weight problems. It estimates body fat based on height and weight. Your health care provider can help determine your BMI and help you achieve or maintain a healthy weight.  For females 86 years of age and older: ? A BMI below 18.5 is considered underweight. ? A BMI of 18.5 to 24.9 is normal. ? A BMI of 25  to 29.9 is considered overweight. ? A BMI of 30 and above is considered obese.  Watch levels of cholesterol and blood lipids  You should start having your blood tested for lipids and cholesterol at 40 years of age, then have this test every 5 years.  You may need to have your cholesterol levels checked more often if: ? Your lipid or cholesterol levels are high. ? You are older than 40 years of age. ? You are at high risk for heart disease.  Cancer screening Lung Cancer  Lung cancer screening is recommended for adults 9-74 years old who are at high risk for lung cancer because of a history of smoking.  A yearly low-dose CT scan of the lungs is recommended for people who: ? Currently smoke. ? Have quit within the past 15 years. ? Have at least a 30-pack-year history of smoking. A pack year is smoking an average of one pack of cigarettes a day for 1 year.  Yearly screening should continue until it has been 15 years since you quit.  Yearly screening should stop if you develop a health problem that would prevent you from having lung cancer treatment.  Breast Cancer  Practice breast self-awareness. This means understanding how your breasts normally appear and feel.  It also means doing regular breast self-exams. Let your health care provider know about any changes, no matter how small.  If you are in your 62s  or 5s, you should have a clinical breast exam (CBE) by a health care provider every 1-3 years as part of a regular health exam.  If you are 36 or older, have a CBE every year. Also consider having a breast X-ray (mammogram) every year.  If you have a family history of breast cancer, talk to your health care provider about genetic screening.  If you are at high risk for breast cancer, talk to your health care provider about having an MRI and a mammogram every year.  Breast cancer gene (BRCA) assessment is recommended for women who have family members with BRCA-related cancers.  BRCA-related cancers include: ? Breast. ? Ovarian. ? Tubal. ? Peritoneal cancers.  Results of the assessment will determine the need for genetic counseling and BRCA1 and BRCA2 testing.  Cervical Cancer Your health care provider may recommend that you be screened regularly for cancer of the pelvic organs (ovaries, uterus, and vagina). This screening involves a pelvic examination, including checking for microscopic changes to the surface of your cervix (Pap test). You may be encouraged to have this screening done every 3 years, beginning at age 30.  For women ages 17-65, health care providers may recommend pelvic exams and Pap testing every 3 years, or they may recommend the Pap and pelvic exam, combined with testing for human papilloma virus (HPV), every 5 years. Some types of HPV increase your risk of cervical cancer. Testing for HPV may also be done on women of any age with unclear Pap test results.  Other health care providers may not recommend any screening for nonpregnant women who are considered low risk for pelvic cancer and who do not have symptoms. Ask your health care provider if a screening pelvic exam is right for you.  If you have had past treatment for cervical cancer or a condition that could lead to cancer, you need Pap tests and screening for cancer for at least 20 years after your treatment. If Pap tests have been discontinued, your risk factors (such as having a new sexual partner) need to be reassessed to determine if screening should resume. Some women have medical problems that increase the chance of getting cervical cancer. In these cases, your health care provider may recommend more frequent screening and Pap tests.  Colorectal Cancer  This type of cancer can be detected and often prevented.  Routine colorectal cancer screening usually begins at 40 years of age and continues through 41 years of age.  Your health care provider may recommend screening at an earlier age if  you have risk factors for colon cancer.  Your health care provider may also recommend using home test kits to check for hidden blood in the stool.  A small camera at the end of a tube can be used to examine your colon directly (sigmoidoscopy or colonoscopy). This is done to check for the earliest forms of colorectal cancer.  Routine screening usually begins at age 91.  Direct examination of the colon should be repeated every 5-10 years through 40 years of age. However, you may need to be screened more often if early forms of precancerous polyps or small growths are found.  Skin Cancer  Check your skin from head to toe regularly.  Tell your health care provider about any new moles or changes in moles, especially if there is a change in a mole's shape or color.  Also tell your health care provider if you have a mole that is larger than the size of a  pencil eraser.  Always use sunscreen. Apply sunscreen liberally and repeatedly throughout the day.  Protect yourself by wearing long sleeves, pants, a wide-brimmed hat, and sunglasses whenever you are outside.  Heart disease, diabetes, and high blood pressure  High blood pressure causes heart disease and increases the risk of stroke. High blood pressure is more likely to develop in: ? People who have blood pressure in the high end of the normal range (130-139/85-89 mm Hg). ? People who are overweight or obese. ? People who are African American.  If you are 9-60 years of age, have your blood pressure checked every 3-5 years. If you are 63 years of age or older, have your blood pressure checked every year. You should have your blood pressure measured twice-once when you are at a hospital or clinic, and once when you are not at a hospital or clinic. Record the average of the two measurements. To check your blood pressure when you are not at a hospital or clinic, you can use: ? An automated blood pressure machine at a pharmacy. ? A home blood  pressure monitor.  If you are between 44 years and 56 years old, ask your health care provider if you should take aspirin to prevent strokes.  Have regular diabetes screenings. This involves taking a blood sample to check your fasting blood sugar level. ? If you are at a normal weight and have a low risk for diabetes, have this test once every three years after 40 years of age. ? If you are overweight and have a high risk for diabetes, consider being tested at a younger age or more often. Preventing infection Hepatitis B  If you have a higher risk for hepatitis B, you should be screened for this virus. You are considered at high risk for hepatitis B if: ? You were born in a country where hepatitis B is common. Ask your health care provider which countries are considered high risk. ? Your parents were born in a high-risk country, and you have not been immunized against hepatitis B (hepatitis B vaccine). ? You have HIV or AIDS. ? You use needles to inject street drugs. ? You live with someone who has hepatitis B. ? You have had sex with someone who has hepatitis B. ? You get hemodialysis treatment. ? You take certain medicines for conditions, including cancer, organ transplantation, and autoimmune conditions.  Hepatitis C  Blood testing is recommended for: ? Everyone born from [birth year] through 1964. ? Anyone with known risk factors for hepatitis C.  Sexually transmitted infections (STIs)  You should be screened for sexually transmitted infections (STIs) including gonorrhea and chlamydia if: ? You are sexually active and are younger than 40 years of age. ? You are older than 40 years of age and your health care provider tells you that you are at risk for this type of infection. ? Your sexual activity has changed since you were last screened and you are at an increased risk for chlamydia or gonorrhea. Ask your health care provider if you are at risk.  If you do not have HIV, but are at risk,  it may be recommended that you take a prescription medicine daily to prevent HIV infection. This is called pre-exposure prophylaxis (PrEP). You are considered at risk if: ? You are sexually active and do not regularly use condoms or know the HIV status of your partner(s). ? You take drugs by injection. ? You are sexually active with a partner who has HIV.  Talk with your health care provider about whether you are at high risk of being infected with HIV. If you choose to begin PrEP, you should first be tested for HIV. You should then be tested every 3 months for as long as you are taking PrEP. Pregnancy  If you are premenopausal and you may become pregnant, ask your health care provider about preconception counseling.  If you may become pregnant, take 400 to 800 micrograms (mcg) of folic acid every day.  If you want to prevent pregnancy, talk to your health care provider about birth control (contraception). Osteoporosis and menopause  Osteoporosis is a disease in which the bones lose minerals and strength with aging. This can result in serious bone fractures. Your risk for osteoporosis can be identified using a bone density scan.  If you are 74 years of age or older, or if you are at risk for osteoporosis and fractures, ask your health care provider if you should be screened.  Ask your health care provider whether you should take a calcium or vitamin D supplement to lower your risk for osteoporosis.  Menopause may have certain physical symptoms and risks.  Hormone replacement therapy may reduce some of these symptoms and risks. Talk to your health care provider about whether hormone replacement therapy is right for you. Follow these instructions at home:  Schedule regular health, dental, and eye exams.  Stay current with your immunizations.  Do not use any tobacco products including cigarettes, chewing tobacco, or electronic cigarettes.  If you are pregnant, do not drink  alcohol.  If you are breastfeeding, limit how much and how often you drink alcohol.  Limit alcohol intake to no more than 1 drink per day for nonpregnant women. One drink equals 12 ounces of beer, 5 ounces of wine, or 1 ounces of hard liquor.  Do not use street drugs.  Do not share needles.  Ask your health care provider for help if you need support or information about quitting drugs.  Tell your health care provider if you often feel depressed.  Tell your health care provider if you have ever been abused or do not feel safe at home. This information is not intended to replace advice given to you by your health care provider. Make sure you discuss any questions you have with your health care provider. Document Released: 07/27/2010 Document Revised: 06/19/2015 Document Reviewed: 10/15/2014 Elsevier Interactive Patient Education  [employer].

## 2016-11-05 ENCOUNTER — Other Ambulatory Visit: Payer: Self-pay | Admitting: Family Medicine

## 2016-11-24 DIAGNOSIS — L603 Nail dystrophy: Secondary | ICD-10-CM | POA: Diagnosis not present

## 2016-12-30 DIAGNOSIS — R21 Rash and other nonspecific skin eruption: Secondary | ICD-10-CM | POA: Diagnosis not present

## 2016-12-30 DIAGNOSIS — M2550 Pain in unspecified joint: Secondary | ICD-10-CM | POA: Diagnosis not present

## 2016-12-31 LAB — LAB REPORT - SCANNED
Calcium: 8.9
EGFR: 117

## 2017-01-06 ENCOUNTER — Encounter: Payer: Self-pay | Admitting: Family Medicine

## 2017-01-06 ENCOUNTER — Telehealth: Payer: Self-pay | Admitting: Family Medicine

## 2017-01-06 NOTE — Telephone Encounter (Signed)
Patient came in to give dr. Lorelei Pont office notes from [facility]. Advises patient that Ivin Booty was out of the office & that it may take a while for Doc to review. Documents in paperwork tray.

## 2017-02-01 DIAGNOSIS — M0609 Rheumatoid arthritis without rheumatoid factor, multiple sites: Secondary | ICD-10-CM | POA: Diagnosis not present

## 2017-02-01 DIAGNOSIS — R21 Rash and other nonspecific skin eruption: Secondary | ICD-10-CM | POA: Diagnosis not present

## 2017-02-01 DIAGNOSIS — R5382 Chronic fatigue, unspecified: Secondary | ICD-10-CM | POA: Diagnosis not present

## 2017-02-01 DIAGNOSIS — M2550 Pain in unspecified joint: Secondary | ICD-10-CM | POA: Diagnosis not present

## 2017-02-10 DIAGNOSIS — Z6841 Body Mass Index (BMI) 40.0 and over, adult: Secondary | ICD-10-CM | POA: Diagnosis not present

## 2017-02-10 DIAGNOSIS — Z01419 Encounter for gynecological examination (general) (routine) without abnormal findings: Secondary | ICD-10-CM | POA: Diagnosis not present

## 2017-02-16 DIAGNOSIS — Z1231 Encounter for screening mammogram for malignant neoplasm of breast: Secondary | ICD-10-CM | POA: Diagnosis not present

## 2017-02-16 DIAGNOSIS — Z1321 Encounter for screening for nutritional disorder: Secondary | ICD-10-CM | POA: Diagnosis not present

## 2017-02-16 DIAGNOSIS — Z131 Encounter for screening for diabetes mellitus: Secondary | ICD-10-CM | POA: Diagnosis not present

## 2017-02-16 DIAGNOSIS — Z1329 Encounter for screening for other suspected endocrine disorder: Secondary | ICD-10-CM | POA: Diagnosis not present

## 2017-02-16 DIAGNOSIS — Z13220 Encounter for screening for lipoid disorders: Secondary | ICD-10-CM | POA: Diagnosis not present

## 2017-04-04 DIAGNOSIS — R21 Rash and other nonspecific skin eruption: Secondary | ICD-10-CM | POA: Diagnosis not present

## 2017-04-04 DIAGNOSIS — M2550 Pain in unspecified joint: Secondary | ICD-10-CM | POA: Diagnosis not present

## 2017-04-04 DIAGNOSIS — M0609 Rheumatoid arthritis without rheumatoid factor, multiple sites: Secondary | ICD-10-CM | POA: Diagnosis not present

## 2017-04-05 ENCOUNTER — Encounter: Payer: Self-pay | Admitting: Family Medicine

## 2017-04-19 ENCOUNTER — Ambulatory Visit: Payer: Federal, State, Local not specified - PPO | Admitting: Medical

## 2017-04-19 ENCOUNTER — Encounter: Payer: Self-pay | Admitting: Medical

## 2017-04-19 VITALS — BP 125/70 | HR 90 | Temp 98.5°F | Resp 16 | Ht 64.0 in | Wt 227.2 lb

## 2017-04-19 DIAGNOSIS — R197 Diarrhea, unspecified: Secondary | ICD-10-CM

## 2017-04-19 DIAGNOSIS — K529 Noninfective gastroenteritis and colitis, unspecified: Secondary | ICD-10-CM

## 2017-04-19 DIAGNOSIS — R110 Nausea: Secondary | ICD-10-CM | POA: Diagnosis not present

## 2017-04-19 MED ORDER — ONDANSETRON 8 MG PO TBDP: 8.0000 mg | ORAL_TABLET | Freq: Three times a day (TID) | ORAL | 0 refills | Status: DC | PRN

## 2017-04-19 NOTE — Progress Notes (Signed)
Subjective:    Patient ID: Christina Parker, female    DOB: 06-07-1975, 42 y.o.   MRN: [id number]  HPI   Pt states some nausea on Friday. Then on Saturday had some diarrhea over the weekend. Pt has some upset stomach as well. Pt states on Saturday and Sunday loose stool about every other hour. Yesterday about 4 loose stools. Today one loose stool.Chills on Saturday and Sunday but none since. No recent antibiotics. Pt reports no suspicious food exposures prior to illness.   Pt works as Marine scientist at [employer].    Pt has some nausea but not vomiting. Pt had uterine ablation. No menses.  Pt husband was sick for 5 days. He had fever, diarrhea and vomiting. He was told viral illness. He is now better.    Pt just started humera in february. Next scheduled injection is this Friday.  Review of Systems  Constitutional: Positive for chills.  HENT: Negative for congestion.   Respiratory: Negative for cough, chest tightness, shortness of breath and wheezing.   Cardiovascular: Negative for chest pain and palpitations.  Gastrointestinal: Positive for abdominal pain, diarrhea and nausea. Negative for constipation and vomiting.       Mild upset stomach.  Skin: Negative for rash.  Neurological: Negative for dizziness, seizures, speech difficulty, weakness and headaches.  Hematological: Negative for adenopathy. Does not bruise/bleed easily.  Psychiatric/Behavioral: Negative for behavioral problems, confusion and decreased concentration. The patient is not nervous/anxious.    Past Medical History:  Diagnosis Date  . Anemia   . Asthma    exercise induction and seonal allerggies casuse flars  . Constipation   . Family history of anesthesia complication    pt's daughter has PO N&V  . GERD (gastroesophageal reflux disease)   . Heart murmur    MVP  . History of vitamin B deficiency   . History of vitamin D deficiency   . MVP (mitral valve prolapse)   . Pneumonia   . Shortness of breath  dyspnea    with abdominal bloating since Jan 2017- "gallbladder"  . Vaginal delivery 1997, 2005     Social History   Socioeconomic History  . Marital status: Married    Spouse name: Not on file  . Number of children: Not on file  . Years of education: Not on file  . Highest education level: Not on file  Occupational History  . Occupation: Therapist, sports, [employer]  . Financial resource strain: Not on file  . Food insecurity:    Worry: Not on file    Inability: Not on file  . Transportation needs:    Medical: Not on file    Non-medical: Not on file  Tobacco Use  . Smoking status: Passive Smoke Exposure - Never Smoker  . Smokeless tobacco: Never Used  . Tobacco comment: Exposed x 11 years. Pt works in home health  Substance and Sexual Activity  . Alcohol use: No    Alcohol/week: 0.0 oz  . Drug use: No  . Sexual activity: Yes  Lifestyle  . Physical activity:    Days per week: Not on file    Minutes per session: Not on file  . Stress: Not on file  Relationships  . Social connections:    Talks on phone: Not on file    Gets together: Not on file    Attends religious service: Not on file    Active member of club or organization: Not on file    Attends  meetings of clubs or organizations: Not on file    Relationship status: Not on file  . Intimate partner violence:    Fear of current or ex partner: Not on file    Emotionally abused: Not on file    Physically abused: Not on file    Forced sexual activity: Not on file  Other Topics Concern  . 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 CHOLECYSTECTOMY N/A 04/24/2015   Procedure: LAPAROSCOPIC CHOLECYSTECTOMY WITH INTRAOPERATIVE CHOLANGIOGRAM;  Surgeon: Erroll Luna, MD;  Location: [facility];  Service: General;  Laterality: N/A;  . DILITATION & CURRETTAGE/HYSTROSCOPY WITH NOVASURE ABLATION N/A 08/02/2013   Procedure:  DILATATION & CURETTAGE/HYSTEROSCOPY WITH NOVASURE ABLATION;  Surgeon: Marylynn Pearson, MD;  Location: [facility];  Service: Gynecology;  Laterality: N/A;  . TUBAL LIGATION  2005    Family History  Problem Relation Age of Onset  . Diabetes Sister   . Diabetes Maternal Grandmother   . Hypertension Maternal Grandmother   . Hypertension Maternal Grandfather   . Diabetes Paternal Grandmother   . Hypertension Paternal Grandmother   . Diabetes Paternal Grandfather   . Hypertension Paternal Grandfather   . Asthma Son   . Allergies Mother   . Allergies Son     No Known Allergies  Current Outpatient Medications on File Prior to Visit  Medication Sig Dispense Refill  . Adalimumab (HUMIRA) 40 MG/0.4ML PSKT Inject into the skin.    Marland Kitchen albuterol (PROVENTIL HFA;VENTOLIN HFA) 108 (90 BASE) MCG/ACT inhaler Inhale 2 puffs into the lungs every 4 (four) hours as needed for wheezing or shortness of breath (cough, shortness of breath or wheezing.). 1 Inhaler 1  . celecoxib (CELEBREX) 200 MG capsule Take 1 capsule by mouth 2 (two) times daily.  2  . cetirizine (ZYRTEC ALLERGY) 10 MG tablet Take 10 mg by mouth daily.    . Cholecalciferol (VITAMIN D) 2000 UNITS tablet Take 2,000 Units by mouth daily.    Marland Kitchen CINNAMON PO Take 1 tablet by mouth daily.    . Coenzyme Q10 (COQ-10) 100 MG CAPS Take 100 mg by mouth daily.    . Cyanocobalamin (B-12) 3000 MCG CAPS Take 3,000 mg by mouth daily.    Marland Kitchen FIBER PO Take 1 tablet by mouth daily.    . fluticasone (FLONASE) 50 MCG/ACT nasal spray Place 2 sprays into both nostrils daily. 16 g 2  . Multiple Vitamins-Minerals (MULTIVITAMIN WITH MINERALS) tablet Take 1 tablet by mouth daily. Organic with co enzymes and probiotic    . Omega-3 Fatty Acids (FISH OIL) 1000 MG CAPS Take 1,000 mg by mouth 2 (two) times daily.    . ondansetron (ZOFRAN ODT) 4 MG disintegrating tablet Take 1 tablet (4 mg total) by mouth every 8 (eight) hours as needed for nausea or vomiting. 20 tablet 0  .  pantoprazole (PROTONIX) 40 MG tablet TAKE 1 TABLET BY MOUTH EVERY DAY 30 tablet 8  . Polyethylene Glycol 3350 (MIRALAX PO) Take by mouth as needed.    . TURMERIC PO Take 400 mg by mouth.    Marland Kitchen UNABLE TO FIND Med Name: Zannie Cove Extract 1282m BID    . vitamin E 1000 UNIT capsule Take 1,000 Units by mouth daily.     No current facility-administered medications on file prior to visit.     BP 125/70 (BP Location: Left Arm, Patient Position: Sitting, Cuff  Size: Large)   Pulse 90   Temp 98.5 F (36.9 C) (Oral)   Resp 16   Ht 5' 4"  (1.626 m)   Wt 227 lb 3.2 oz (103.1 kg)   SpO2 97%   BMI 39.00 kg/m       Objective:   Physical Exam  General Appearance- Not in acute distress.  HEENT Eyes- Scleraeral/Conjuntiva-bilat- Not Yellow. Mouth & Throat- Normal.  Chest and Lung Exam Auscultation: Breath sounds:-Normal. Adventitious sounds:- No Adventitious sounds.  Cardiovascular Auscultation:Rythm - Regular. Heart Sounds -Normal heart sounds.  Abdomen Inspection:-Inspection Normal.  Palpation/Perucssion: Palpation and Percussion of the abdomen reveal- Non Tender, No Rebound tenderness, No rigidity(Guarding) and No Palpable abdominal masses.  Liver:-Normal.  Spleen:- Normal.   Back - no cva tenderness.  Skin- moist. Feels well hydrated/moist.      Assessment & Plan:  Your GI symptoms recently likely represent viral gastroenteritis similar to your husband who has improved relatively quickly.  Your symptoms are gradually improving so I expect by this Friday you will feel much better.  For your recent nausea, I am prescribing Zofran.  For persisting mild loose stools can use Imodium right ear over-the-counter.  Please make sure you are well hydrated.  I recommend propel fitness water.  As your symptoms subside and you start to eat more still stick with bland foods and avoid greasy/fried foods and meats.  Also for the next 3-5 days avoid fruit juices.  If your symptoms  loose stools worsen again as you described over this weekend then would recommend stool panel kit studies to be done.  Work excuse to return on Thursday.  Follow-up in 7 days or as needed.  Note did asked patient to call her rheumatologist by this Thursday and get advice on whether use the Humira on Friday or may be postponed until this coming Monday.  Patient agreed she would call them.  Mackie Pai, PA-C

## 2017-04-19 NOTE — Patient Instructions (Signed)
Your GI symptoms recently likely represent viral gastroenteritis similar to your husband who has improved relatively quickly.  Your symptoms are gradually improving so I expect by this Friday you will feel much better.  For your recent nausea, I am prescribing Zofran.  For persisting mild loose stools can use Imodium right ear over-the-counter.  Please make sure you are well hydrated.  I recommend propel fitness water.  As your symptoms subside and you start to eat more still stick with bland foods and avoid greasy/fried foods and meats.  Also for the next 3-5 days avoid fruit juices.  If your symptoms loose stools worsen again as you described over this weekend then would recommend stool panel kit studies to be done.  Work excuse to return on Thursday.  Follow-up in 7 days or as needed. 

## 2017-05-30 DIAGNOSIS — R21 Rash and other nonspecific skin eruption: Secondary | ICD-10-CM | POA: Diagnosis not present

## 2017-05-30 DIAGNOSIS — M0609 Rheumatoid arthritis without rheumatoid factor, multiple sites: Secondary | ICD-10-CM | POA: Diagnosis not present

## 2017-05-30 DIAGNOSIS — M2550 Pain in unspecified joint: Secondary | ICD-10-CM | POA: Diagnosis not present

## 2017-06-08 ENCOUNTER — Encounter: Payer: Self-pay | Admitting: Family Medicine

## 2017-06-11 NOTE — Progress Notes (Signed)
[facility] [address], [place], [place] [address] [phone]  Date:  06/13/2017   Name:  Christina Parker   DOB:  05/03/1975   MRN:  [id number]  PCP:  Darreld Mclean, MD    Chief Complaint: Dietary Needs (appetite supplement, working out 5 days a week,2 doses of prednisone in 6 months)   History of Present Illness:  Christina Parker is a 42 y.o. 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 Psoriatic arthritis ([place]) 08/05/2016  . Routine general medical examination at a health care facility 09/03/2015  . Arthralgia 09/01/2015  . GERD (gastroesophageal reflux disease) 05/24/2014  . Morbid obesity ([place]) 05/24/2014  . MVP (mitral valve prolapse) 01/16/2014  . Reactive airway disease 12/07/2013    Past Medical History:  Diagnosis Date  . Anemia   . Asthma    exercise induction and seonal allerggies casuse flars  . Constipation   . Family history of anesthesia complication    pt's daughter has PO N&V  . GERD  (gastroesophageal reflux disease)   . Heart murmur    MVP  . History of vitamin B deficiency   . History of vitamin D deficiency   . MVP (mitral valve prolapse)   . Pneumonia   . Shortness of breath dyspnea    with abdominal bloating since Jan 2017- "gallbladder"  . Vaginal delivery 1997, 2005    Past Surgical History:  Procedure Laterality Date  . CHOLECYSTECTOMY N/A 04/24/2015   Procedure: LAPAROSCOPIC CHOLECYSTECTOMY WITH INTRAOPERATIVE CHOLANGIOGRAM;  Surgeon: Erroll Luna, MD;  Location: [facility];  Service: General;  Laterality: N/A;  . DILITATION & CURRETTAGE/HYSTROSCOPY WITH NOVASURE ABLATION N/A 08/02/2013   Procedure: DILATATION & CURETTAGE/HYSTEROSCOPY WITH NOVASURE ABLATION;  Surgeon: Marylynn Pearson, MD;  Location: [facility];  Service: Gynecology;  Laterality: N/A;  . TUBAL LIGATION  2005    Social History   Tobacco Use  . Smoking status: Passive Smoke Exposure - Never Smoker  . Smokeless tobacco: Never Used  . Tobacco comment: Exposed x 11 years. Pt works in home health  Substance Use Topics  . Alcohol use: No    Alcohol/week: 0.0 oz  . Drug use: No    Family History  Problem Relation Age of Onset  . Diabetes Sister   .  Diabetes Maternal Grandmother   . Hypertension Maternal Grandmother   . Hypertension Maternal Grandfather   . Diabetes Paternal Grandmother   . Hypertension Paternal Grandmother   . Diabetes Paternal Grandfather   . Hypertension Paternal Grandfather   . Asthma Son   . Allergies Mother   . Allergies Son     No Known Allergies  Medication list has been reviewed and updated.  Current Outpatient Medications on File Prior to Visit  Medication Sig Dispense Refill  . Adalimumab (HUMIRA) 40 MG/0.4ML PSKT Inject into the skin.    Marland Kitchen albuterol (PROVENTIL HFA;VENTOLIN HFA) 108 (90 BASE) MCG/ACT inhaler Inhale 2 puffs into the lungs every 4 (four) hours as needed for wheezing or shortness of breath (cough, shortness of breath or wheezing.). 1 Inhaler 1   . celecoxib (CELEBREX) 200 MG capsule Take 1 capsule by mouth 2 (two) times daily.  2  . cetirizine (ZYRTEC ALLERGY) 10 MG tablet Take 10 mg by mouth daily.    . Cholecalciferol (VITAMIN D) 2000 UNITS tablet Take 2,000 Units by mouth daily.    Marland Kitchen CINNAMON PO Take 1 tablet by mouth daily.    . Coenzyme Q10 (COQ-10) 100 MG CAPS Take 100 mg by mouth daily.    . Cyanocobalamin (B-12) 3000 MCG CAPS Take 3,000 mg by mouth daily.    Marland Kitchen FIBER PO Take 1 tablet by mouth daily.    . fluticasone (FLONASE) 50 MCG/ACT nasal spray Place 2 sprays into both nostrils daily. 16 g 2  . Multiple Vitamins-Minerals (MULTIVITAMIN WITH MINERALS) tablet Take 1 tablet by mouth daily. Organic with co enzymes and probiotic    . Omega-3 Fatty Acids (FISH OIL) 1000 MG CAPS Take 1,000 mg by mouth 2 (two) times daily.    . ondansetron (ZOFRAN ODT) 4 MG disintegrating tablet Take 1 tablet (4 mg total) by mouth every 8 (eight) hours as needed for nausea or vomiting. 20 tablet 0  . ondansetron (ZOFRAN ODT) 8 MG disintegrating tablet Take 1 tablet (8 mg total) by mouth every 8 (eight) hours as needed for nausea or vomiting. 20 tablet 0  . pantoprazole (PROTONIX) 40 MG tablet TAKE 1 TABLET BY MOUTH EVERY DAY 30 tablet 8  . Polyethylene Glycol 3350 (MIRALAX PO) Take by mouth as needed.    . TURMERIC PO Take 400 mg by mouth.    Marland Kitchen UNABLE TO FIND Med Name: Zannie Cove Extract 1260m BID    . vitamin E 1000 UNIT capsule Take 1,000 Units by mouth daily.     No current facility-administered medications on file prior to visit.     Review of Systems:  As per HPI- otherwise negative.   Physical Examination: Vitals:   06/13/17 1350  BP: 138/80  Pulse: 89  Resp: 16  SpO2: 99%   Vitals:   06/13/17 1350  Weight: 232 lb (105.2 kg)  Height: 5' 4"  (1.626 m)   Body mass index is 39.82 kg/m. Ideal Body Weight: Weight in (lb) to have BMI = 25: 145.3  GEN: WDWN, NAD, Non-toxic, A & O x 3, obese, otherwise looks well  HEENT:  Atraumatic, Normocephalic. Neck supple. No masses, No LAD.   Ears and Nose: No external deformity. CV: RRR, No M/G/R. No JVD. No thrill. No extra heart sounds. PULM: CTA B, no wheezes, crackles, rhonchi. No retractions. No resp. distress. No accessory muscle use. ABD: S, NT, ND EXTR: No c/c/e NEURO Normal gait.  PSYCH: Normally interactive. Conversant. Not depressed or anxious appearing.  Calm demeanor.  Assessment and Plan: Morbid obesity ([place]) - Plan: Naltrexone-buPROPion HCl ER 8-90 MG TB12  Pre-diabetes  She would like to try an rx for weight loss Will rx contrave for her to try- she will plan to see me in 3 months to check on her progress   Signed Lamar Blinks, MD

## 2017-06-13 ENCOUNTER — Ambulatory Visit: Payer: Federal, State, Local not specified - PPO | Admitting: Family Medicine

## 2017-06-13 ENCOUNTER — Encounter: Payer: Self-pay | Admitting: Family Medicine

## 2017-06-13 DIAGNOSIS — R7303 Prediabetes: Secondary | ICD-10-CM | POA: Diagnosis not present

## 2017-06-13 MED ORDER — NALTREXONE-BUPROPION HCL ER 8-90 MG PO TB12: ORAL_TABLET | ORAL | 6 refills | Status: DC

## 2017-06-13 NOTE — Patient Instructions (Signed)
Good to see you today!   We will have you start on Contrave for weight loss Go to the Contrave website to get a savings card first- you can also get home delivery to save more money The maintenance dose is 2 pills twice a day-  However you will start more slowly  Start with 1 pill am for 1 week Then 1 pill twice a day for 1 week Then 2 am and 1 pm for one week   Please let me know how this works for you and plan to see me in about 3 months

## 2017-07-05 DIAGNOSIS — M0609 Rheumatoid arthritis without rheumatoid factor, multiple sites: Secondary | ICD-10-CM | POA: Diagnosis not present

## 2017-07-05 DIAGNOSIS — M2550 Pain in unspecified joint: Secondary | ICD-10-CM | POA: Diagnosis not present

## 2017-07-05 DIAGNOSIS — R21 Rash and other nonspecific skin eruption: Secondary | ICD-10-CM | POA: Diagnosis not present

## 2017-09-06 NOTE — Progress Notes (Addendum)
[place] at [facility] [address], [place], [place] [address] [phone]  Date:  09/08/2017   Name:  Christina Parker   DOB:  [birth year]   MRN:  [id number]  PCP:  Darreld Mclean, MD    Chief Complaint: No chief complaint on file.   History of Present Illness:  Christina Parker is a 42 y.o. very pleasant female patient who presents with the following:  Short term follow-up today History of obesity, psoriatic arthritis, GERD I saw her in May to start Contrave for weight loss, needs to check in today  Wt Readings from Last 3 Encounters:  09/08/17 221 lb (100.2 kg)  06/13/17 232 lb (105.2 kg)  04/19/17 227 lb 3.2 oz (103.1 kg)   She had tried the contrave but it gave her abdominal cramping and increased her IBS sx, constipation  She took it for 6 weeks and then had to stop  She has been working out with her husband but he is having some foot and ankle issues  Also her son just hurt his hip  However pt has been working on her diet and has lost some weight  She is not eating out- she is also into saving money so this is also helping to motivate   She is NOT fasting   Lab Results  Component Value Date   HGBA1C 6.2 08/16/2016     Patient Active Problem List   Diagnosis Date Noted  . Psoriatic arthritis ([place]) 08/05/2016  . Routine general medical examination at a health care facility 09/03/2015  . Arthralgia 09/01/2015  . GERD (gastroesophageal reflux disease) 05/24/2014  . Morbid obesity ([place]) 05/24/2014  . MVP (mitral valve prolapse) 01/16/2014  . Reactive airway disease 12/07/2013    Past Medical History:  Diagnosis Date  . Anemia   . Asthma    exercise induction and seonal allerggies casuse flars  . Constipation   . Family history of anesthesia complication    pt's daughter has PO N&V  . GERD (gastroesophageal reflux disease)   . Heart murmur    MVP  . History of vitamin B deficiency   . History of  vitamin D deficiency   . MVP (mitral valve prolapse)   . Pneumonia   . Shortness of breath dyspnea    with abdominal bloating since Jan 2017- "gallbladder"  . Vaginal delivery 1997, 2005    Past Surgical History:  Procedure Laterality Date  . CHOLECYSTECTOMY N/A 04/24/2015   Procedure: LAPAROSCOPIC CHOLECYSTECTOMY WITH INTRAOPERATIVE CHOLANGIOGRAM;  Surgeon: Erroll Luna, MD;  Location: [place];  Service: General;  Laterality: N/A;  . DILITATION & CURRETTAGE/HYSTROSCOPY WITH NOVASURE ABLATION N/A 08/02/2013   Procedure: DILATATION & CURETTAGE/HYSTEROSCOPY WITH NOVASURE ABLATION;  Surgeon: Marylynn Pearson, MD;  Location: [facility];  Service: Gynecology;  Laterality: N/A;  . TUBAL LIGATION  2005    Social History   Tobacco Use  . Smoking status: Passive Smoke Exposure - Never Smoker  . Smokeless tobacco: Never Used  . Tobacco comment: Exposed x 11 years. Pt works in home health  Substance Use Topics  . Alcohol use: No    Alcohol/week: 0.0 standard drinks  . Drug use: No    Family History  Problem Relation Age of Onset  . Diabetes Sister   . Diabetes Maternal Grandmother   . Hypertension Maternal Grandmother   . Hypertension Maternal Grandfather   . Diabetes Paternal Grandmother   . Hypertension Paternal Grandmother   .  Diabetes Paternal Grandfather   . Hypertension Paternal Grandfather   . Asthma Son   . Allergies Mother   . Allergies Son     No Known Allergies  Medication list has been reviewed and updated.  Current Outpatient Medications on File Prior to Visit  Medication Sig Dispense Refill  . Adalimumab (HUMIRA) 40 MG/0.4ML PSKT Inject into the skin.    Marland Kitchen albuterol (PROVENTIL HFA;VENTOLIN HFA) 108 (90 BASE) MCG/ACT inhaler Inhale 2 puffs into the lungs every 4 (four) hours as needed for wheezing or shortness of breath (cough, shortness of breath or wheezing.). 1 Inhaler 1  . celecoxib (CELEBREX) 200 MG capsule Take 1 capsule by mouth 2 (two) times daily.  2  .  cetirizine (ZYRTEC ALLERGY) 10 MG tablet Take 10 mg by mouth daily.    . Cholecalciferol (VITAMIN D) 2000 UNITS tablet Take 2,000 Units by mouth daily.    Marland Kitchen CINNAMON PO Take 1 tablet by mouth daily.    . Coenzyme Q10 (COQ-10) 100 MG CAPS Take 100 mg by mouth daily.    . Cyanocobalamin (B-12) 3000 MCG CAPS Take 3,000 mg by mouth daily.    Marland Kitchen FIBER PO Take 1 tablet by mouth daily.    . fluticasone (FLONASE) 50 MCG/ACT nasal spray Place 2 sprays into both nostrils daily. 16 g 2  . Multiple Vitamins-Minerals (MULTIVITAMIN WITH MINERALS) tablet Take 1 tablet by mouth daily. Organic with co enzymes and probiotic    . Omega-3 Fatty Acids (FISH OIL) 1000 MG CAPS Take 1,000 mg by mouth 2 (two) times daily.    . ondansetron (ZOFRAN ODT) 4 MG disintegrating tablet Take 1 tablet (4 mg total) by mouth every 8 (eight) hours as needed for nausea or vomiting. 20 tablet 0  . ondansetron (ZOFRAN ODT) 8 MG disintegrating tablet Take 1 tablet (8 mg total) by mouth every 8 (eight) hours as needed for nausea or vomiting. 20 tablet 0  . pantoprazole (PROTONIX) 40 MG tablet TAKE 1 TABLET BY MOUTH EVERY DAY 30 tablet 8  . Polyethylene Glycol 3350 (MIRALAX PO) Take by mouth as needed.    . TURMERIC PO Take 400 mg by mouth.    Marland Kitchen UNABLE TO FIND Med Name: Zannie Cove Extract 1260m BID    . vitamin E 1000 UNIT capsule Take 1,000 Units by mouth daily.     No current facility-administered medications on file prior to visit.     Review of Systems:  As per HPI- otherwise negative. No fever or chills  Physical Examination: Vitals:   09/08/17 1441  BP: 120/88  Pulse: (!) 104  Resp: 16  SpO2: 98%   Vitals:   09/08/17 1441  Weight: 221 lb (100.2 kg)  Height: 5' 4"  (1.626 m)   Body mass index is 37.93 kg/m. Ideal Body Weight: Weight in (lb) to have BMI = 25: 145.3  GEN: WDWN, NAD, Non-toxic, A & O x 3, obese, looks well  HEENT: Atraumatic, Normocephalic. Neck supple. No masses, No LAD. Ears and Nose: No  external deformity. CV: RRR with occasional early beat, No M/G/R. No JVD. No thrill. No extra heart sounds. PULM: CTA B, no wheezes, crackles, rhonchi. No retractions. No resp. distress. No accessory muscle use. EXTR: No c/c/e NEURO Normal gait.  PSYCH: Normally interactive. Conversant. Not depressed or anxious appearing.  Calm demeanor.    Assessment and Plan: Pre-diabetes - Plan: Comprehensive metabolic panel, Hemoglobin A1c  [place]bid [place]  Screening for hyperlipidemia - Plan: Lipid panel  Medication monitoring  encounter - Plan: CBC, Comprehensive metabolic panel  Weight loss counseling, encounter for  Pt has not been able to tolerate contrave but lost weight anyway- this is even better.  Congratulated her and discussed ways for her to continue her progress  She will continue to work on this  Check labs for her today Plan to visit in about 6 months Pt notes that she sees cardiology annually for MVP and will continue to do so    Signed Lamar Blinks, MD  Received her labs 8/16 Results for orders placed or performed in visit on 09/08/17  CBC  Result Value Ref Range   WBC 8.9 4.0 - 10.5 K/uL   RBC 4.57 3.87 - 5.11 Mil/uL   Platelets 333.0 150.0 - 400.0 K/uL   Hemoglobin 12.8 12.0 - 15.0 g/dL   HCT 38.4 36.0 - 46.0 %   MCV 84.1 78.0 - 100.0 fl   MCHC 33.4 30.0 - 36.0 g/dL   RDW 13.8 11.5 - 15.5 %  Comprehensive metabolic panel  Result Value Ref Range   Sodium 142 135 - 145 mEq/L   Potassium 3.4 (L) 3.5 - 5.1 mEq/L   Chloride 106 96 - 112 mEq/L   CO2 29 19 - 32 mEq/L   Glucose, Bld 114 (H) 70 - 99 mg/dL   BUN 16 6 - 23 mg/dL   Creatinine, Ser 0.94 0.40 - 1.20 mg/dL   Total Bilirubin 0.4 0.2 - 1.2 mg/dL   Alkaline Phosphatase 64 39 - 117 U/L   AST 18 0 - 37 U/L   ALT 19 0 - 35 U/L   Total Protein 7.1 6.0 - 8.3 g/dL   Albumin 3.8 3.5 - 5.2 g/dL   Calcium 9.4 8.4 - 10.5 mg/dL   GFR 84.35 >60.00 mL/min  Lipid panel  Result Value Ref Range   Cholesterol  126 0 - 200 mg/dL   Triglycerides 105.0 0.0 - 149.0 mg/dL   HDL 34.00 (L) >39.00 mg/dL   VLDL 21.0 0.0 - 40.0 mg/dL   LDL Cholesterol 71 0 - 99 mg/dL   Total CHOL/HDL Ratio 4    NonHDL 92.23   Hemoglobin A1c  Result Value Ref Range   Hgb A1c MFr Bld 5.7 4.6 - 6.5 %   Message to pt  Blood counts are normal Metabolic profile looks ok Cholesterol is overall ok, but would like to see your HDL (good cholesterol) higher- an omega 3 supplement may help Your A1c is better!  Keep up the good work, let's visit in about 6 months JC

## 2017-09-08 ENCOUNTER — Encounter: Payer: Self-pay | Admitting: Family Medicine

## 2017-09-08 ENCOUNTER — Ambulatory Visit: Payer: Federal, State, Local not specified - PPO | Admitting: Family Medicine

## 2017-09-08 VITALS — BP 120/88 | HR 104 | Resp 16 | Ht 64.0 in | Wt 221.0 lb

## 2017-09-08 DIAGNOSIS — Z713 Dietary counseling and surveillance: Secondary | ICD-10-CM

## 2017-09-08 DIAGNOSIS — R7303 Prediabetes: Secondary | ICD-10-CM | POA: Diagnosis not present

## 2017-09-08 DIAGNOSIS — Z13220 Encounter for screening for lipoid disorders: Secondary | ICD-10-CM | POA: Diagnosis not present

## 2017-09-08 DIAGNOSIS — Z5181 Encounter for therapeutic drug level monitoring: Secondary | ICD-10-CM | POA: Diagnosis not present

## 2017-09-08 NOTE — Patient Instructions (Signed)
It was good to see you- you are doing a great job with weigh loss on your own!  Keep it up- I will be in touch with your labs asap Let's plan to visit in about 6 months

## 2017-09-09 ENCOUNTER — Encounter: Payer: Self-pay | Admitting: Family Medicine

## 2017-09-09 LAB — COMPREHENSIVE METABOLIC PANEL
ALT: 19 U/L (ref 0–35)
AST: 18 U/L (ref 0–37)
Albumin: 3.8 g/dL (ref 3.5–5.2)
Alkaline Phosphatase: 64 U/L (ref 39–117)
BUN: 16 mg/dL (ref 6–23)
CHLORIDE: 106 meq/L (ref 96–112)
CO2: 29 meq/L (ref 19–32)
CREATININE: 0.94 mg/dL (ref 0.40–1.20)
Calcium: 9.4 mg/dL (ref 8.4–10.5)
GFR: 84.35 mL/min (ref >60.00)
GLUCOSE: 114 mg/dL — AB (ref 70–99)
POTASSIUM: 3.4 meq/L — AB (ref 3.5–5.1)
SODIUM: 142 meq/L (ref 135–145)
Total Bilirubin: 0.4 mg/dL (ref 0.2–1.2)
Total Protein: 7.1 g/dL (ref 6.0–8.3)

## 2017-09-09 LAB — CBC
HEMATOCRIT: 38.4 % (ref 36.0–46.0)
Hemoglobin: 12.8 g/dL (ref 12.0–15.0)
MCHC: 33.4 g/dL (ref 30.0–36.0)
MCV: 84.1 fl (ref 78.0–100.0)
Platelets: 333 10*3/uL (ref 150.0–400.0)
RBC: 4.57 Mil/uL (ref 3.87–5.11)
RDW: 13.8 % (ref 11.5–15.5)
WBC: 8.9 10*3/uL (ref 4.0–10.5)

## 2017-09-09 LAB — HEMOGLOBIN A1C: Hgb A1c MFr Bld: 5.7 % (ref 4.6–6.5)

## 2017-09-09 LAB — LIPID PANEL
CHOL/HDL RATIO: 4
CHOLESTEROL: 126 mg/dL (ref 0–200)
HDL: 34 mg/dL — ABNORMAL LOW (ref >39.00)
LDL Cholesterol: 71 mg/dL (ref 0–99)
NonHDL: 92.23
Triglycerides: 105 mg/dL (ref 0.0–149.0)
VLDL: 21 mg/dL (ref 0.0–40.0)

## 2017-10-04 ENCOUNTER — Encounter: Payer: Self-pay | Admitting: Family Medicine

## 2017-10-10 ENCOUNTER — Telehealth: Payer: Self-pay

## 2017-10-10 NOTE — Telephone Encounter (Signed)
PA initiated via Covermymeds; KEY: [id number]. PA approved.

## 2017-10-31 ENCOUNTER — Other Ambulatory Visit: Payer: Self-pay | Admitting: Family Medicine

## 2017-11-10 DIAGNOSIS — R5382 Chronic fatigue, unspecified: Secondary | ICD-10-CM | POA: Diagnosis not present

## 2017-11-10 DIAGNOSIS — R21 Rash and other nonspecific skin eruption: Secondary | ICD-10-CM | POA: Diagnosis not present

## 2017-11-10 DIAGNOSIS — M2550 Pain in unspecified joint: Secondary | ICD-10-CM | POA: Diagnosis not present

## 2017-11-10 DIAGNOSIS — M0609 Rheumatoid arthritis without rheumatoid factor, multiple sites: Secondary | ICD-10-CM | POA: Diagnosis not present

## 2017-11-11 LAB — LAB REPORT - SCANNED
Calcium: 9
EGFR: 124

## 2017-11-14 ENCOUNTER — Encounter: Payer: Self-pay | Admitting: Family Medicine

## 2017-11-19 ENCOUNTER — Encounter: Payer: Self-pay | Admitting: Family Medicine

## 2017-11-19 ENCOUNTER — Ambulatory Visit: Payer: Federal, State, Local not specified - PPO | Admitting: Family Medicine

## 2017-11-19 VITALS — BP 132/98 | HR 99 | Temp 99.0°F | Wt 218.0 lb

## 2017-11-19 DIAGNOSIS — J329 Chronic sinusitis, unspecified: Secondary | ICD-10-CM

## 2017-11-19 DIAGNOSIS — B9789 Other viral agents as the cause of diseases classified elsewhere: Secondary | ICD-10-CM | POA: Diagnosis not present

## 2017-11-19 MED ORDER — PREDNISONE 10 MG PO TABS: ORAL_TABLET | ORAL | 0 refills | Status: DC

## 2017-11-19 MED ORDER — PROMETHAZINE-DM 6.25-15 MG/5ML PO SYRP: 5.0000 mL | ORAL_SOLUTION | Freq: Four times a day (QID) | ORAL | 0 refills | Status: DC | PRN

## 2017-11-19 NOTE — Progress Notes (Signed)
   Subjective:    Patient ID: Christina Parker, female    DOB: 07-11-1975, 42 y.o.   MRN: [id number]  HPI URI- 'just some sinus stuff going on', cough- worse at night.  Found some old Tessalon and used w/o improvement.  sxs started ~1 week ago.  + fatigue, some dizziness this AM.  Some tightness in chest w/ cough- sputum is 'thick and yellow'.  Mild SOB w/ exertion.  + sick contacts.  Using Flonase, Zyrtec, Sinunex, Albuterol.  Denies facial pain/pressure, no HA.  Bilateral ear fullness.  Pt is immunosuppressed on Humira for RA   Review of Systems For ROS see HPI     Objective:   Physical Exam  Constitutional: She appears well-developed and well-nourished. No distress.  HENT:  Head: Normocephalic and atraumatic.  Right Ear: Tympanic membrane normal.  Left Ear: Tympanic membrane normal.  Nose: Mucosal edema and rhinorrhea present. Right sinus exhibits maxillary sinus tenderness (mild TTP). Right sinus exhibits no frontal sinus tenderness. Left sinus exhibits maxillary sinus tenderness (mild TTP). Left sinus exhibits no frontal sinus tenderness.  Mouth/Throat: Mucous membranes are normal. Posterior oropharyngeal erythema (w/ PND) present.  Eyes: Pupils are equal, round, and reactive to light. Conjunctivae and EOM are normal.  Neck: Normal range of motion. Neck supple.  Cardiovascular: Normal rate, regular rhythm and normal heart sounds.  Pulmonary/Chest: Effort normal and breath sounds normal. No respiratory distress. She has no wheezes. She has no rales.  Lymphadenopathy:    She has no cervical adenopathy.  Vitals reviewed.         Assessment & Plan:  Viral sinusitis- no evidence of bacterial infection but clearly has inflammation and viral illness.  Start Prednisone to improve sinus pressure and cough.  Reviewed supportive care and red flags that should prompt return.  Pt expressed understanding and is in agreement w/ plan.

## 2017-11-19 NOTE — Patient Instructions (Signed)
Follow up as needed or as scheduled (particularly if symptoms change or worsen) START the Prednisone as directed- 3 tabs at the same time x3 days, then 2 tabs x3 days, then 1 tab.  Take w/ food DRINK lots of fluids REST! CONTINUE the Flonase and Zyrtec daily USE the cough syrup as needed- may cause drowsiness ADD Mucinex DM or Delsym for cough relief w/out drowsiness Call with any questions or concerns Hang in there!

## 2017-11-23 ENCOUNTER — Telehealth: Payer: Self-pay | Admitting: Family Medicine

## 2017-11-23 NOTE — Telephone Encounter (Signed)
Patient coming in tomorrow at 3:15 to see PCP.

## 2017-11-23 NOTE — Telephone Encounter (Signed)
Copied from [place] [id number]. Topic: Quick Communication - See Telephone Encounter >> Nov 23, 2017  9:29 AM Ahmed Prima L wrote: CRM for notification. See Telephone encounter for: 11/23/17.  Patient states she went to the [facility] at [facility] and was given predniSONE (DELTASONE) 10 MG tablet and has about 2 days left along with promethazine-dextromethorphan (PROMETHAZINE-DM) 6.25-15 MG/5ML syrup. She said she is still spitting up green mucus and has chest congestion along with sore throat and still coughing ( more at night, than during the day ) she is wanting to know could Dr Lorelei Pont give her something else to take? Please advise.  [id number] [id number]

## 2017-11-23 NOTE — Telephone Encounter (Signed)
Please call her and offer an appt with me tomorrow for evaluation and treatment

## 2017-11-24 ENCOUNTER — Encounter: Payer: Self-pay | Admitting: Family Medicine

## 2017-11-24 ENCOUNTER — Ambulatory Visit: Payer: Federal, State, Local not specified - PPO | Admitting: Family Medicine

## 2017-11-24 VITALS — BP 126/88 | HR 101 | Temp 98.2°F | Resp 16 | Ht 64.0 in | Wt 222.0 lb

## 2017-11-24 DIAGNOSIS — J40 Bronchitis, not specified as acute or chronic: Secondary | ICD-10-CM | POA: Diagnosis not present

## 2017-11-24 MED ORDER — AZITHROMYCIN 250 MG PO TABS: ORAL_TABLET | ORAL | 0 refills | Status: DC

## 2017-11-24 NOTE — Patient Instructions (Signed)
Good to see you today- we will treat you for bronchitis with azithromycin. Let me know if not feeling better in the next few day-Sooner if worse.

## 2017-11-24 NOTE — Progress Notes (Signed)
[place] at [facility] [address], [place], [place] [address] [phone]  Date:  11/24/2017   Name:  Christina Parker   DOB:  05-21-1975   MRN:  [id number]  PCP:  Darreld Mclean, MD    Chief Complaint: URI (seen at saturday clinic-given prednisone and promethazine syrup no improvement, coughing productive but difficult to get up, no known fever )   History of Present Illness:  Christina Parker is a 42 y.o. very pleasant female patient who presents with the following:  History of RA, reactive airway, GERD, obesity Here today with concern of illness Seen at [facility] clinic this past weekend per Dr. Birdie Riddle-   Viral sinusitis- no evidence of bacterial infection but clearly has inflammation and viral illness.  Start Prednisone to improve sinus pressure and cough.  Reviewed supportive care and red flags that should prompt return.  Pt expressed understanding and is in agreement w/ plan.   Then had called in yesterday with concern of not being well yet  She has been ill for nearly 3 weeks now She notes cough productive of thick mucus She may bring up some discolored mucus- can be very thick She feels like her sx are now mostly in her chest She did have a fever but non for the last several days The prednisone did seem to help but she is not yet well She is having aches and chills  No vomiting or diarrhea No ST She is using albuterol prn S/p uterine ablation Her ears can be crackly but not painful   She does her humira every other week - will delay until she finishes her abx History of RA  Patient Active Problem List   Diagnosis Date Noted  . Rheumatoid arthritis ([place]) 08/05/2016  . Routine general medical examination at a health care facility 09/03/2015  . Arthralgia 09/01/2015  . GERD (gastroesophageal reflux disease) 05/24/2014  . Morbid obesity ([place]) 05/24/2014  . MVP (mitral valve prolapse) 01/16/2014  . Reactive  airway disease 12/07/2013    Past Medical History:  Diagnosis Date  . Anemia   . Asthma    exercise induction and seonal allerggies casuse flars  . Constipation   . Family history of anesthesia complication    pt's daughter has PO N&V  . GERD (gastroesophageal reflux disease)   . Heart murmur    MVP  . History of vitamin B deficiency   . History of vitamin D deficiency   . MVP (mitral valve prolapse)   . Pneumonia   . Shortness of breath dyspnea    with abdominal bloating since Jan 2017- "gallbladder"  . Vaginal delivery 1997, 2005    Past Surgical History:  Procedure Laterality Date  . CHOLECYSTECTOMY N/A 04/24/2015   Procedure: LAPAROSCOPIC CHOLECYSTECTOMY WITH INTRAOPERATIVE CHOLANGIOGRAM;  Surgeon: Erroll Luna, MD;  Location: [place];  Service: General;  Laterality: N/A;  . DILITATION & CURRETTAGE/HYSTROSCOPY WITH NOVASURE ABLATION N/A 08/02/2013   Procedure: DILATATION & CURETTAGE/HYSTEROSCOPY WITH NOVASURE ABLATION;  Surgeon: Marylynn Pearson, MD;  Location: [facility];  Service: Gynecology;  Laterality: N/A;  . TUBAL LIGATION  2005    Social History   Tobacco Use  . Smoking status: Passive Smoke Exposure - Never Smoker  . Smokeless tobacco: Never Used  . Tobacco comment: Exposed x 11 years. Pt works in home health  Substance Use Topics  . Alcohol use: No    Alcohol/week: 0.0 standard drinks  . Drug use:  No    Family History  Problem Relation Age of Onset  . Diabetes Sister   . Diabetes Maternal Grandmother   . Hypertension Maternal Grandmother   . Hypertension Maternal Grandfather   . Diabetes Paternal Grandmother   . Hypertension Paternal Grandmother   . Diabetes Paternal Grandfather   . Hypertension Paternal Grandfather   . Asthma Son   . Allergies Mother   . Allergies Son     No Known Allergies  Medication list has been reviewed and updated.  Current Outpatient Medications on File Prior to Visit  Medication Sig Dispense Refill  . Adalimumab (HUMIRA)  40 MG/0.4ML PSKT Inject into the skin.    Marland Kitchen albuterol (PROVENTIL HFA;VENTOLIN HFA) 108 (90 BASE) MCG/ACT inhaler Inhale 2 puffs into the lungs every 4 (four) hours as needed for wheezing or shortness of breath (cough, shortness of breath or wheezing.). 1 Inhaler 1  . celecoxib (CELEBREX) 200 MG capsule Take 1 capsule by mouth 2 (two) times daily.  2  . cetirizine (ZYRTEC ALLERGY) 10 MG tablet Take 10 mg by mouth daily.    . Cholecalciferol (VITAMIN D) 2000 UNITS tablet Take 2,000 Units by mouth daily.    Marland Kitchen CINNAMON PO Take 1 tablet by mouth daily.    . Coenzyme Q10 (COQ-10) 100 MG CAPS Take 100 mg by mouth daily.    . Cyanocobalamin (B-12) 3000 MCG CAPS Take 3,000 mg by mouth daily.    Marland Kitchen FIBER PO Take 1 tablet by mouth daily.    . fluticasone (FLONASE) 50 MCG/ACT nasal spray Place 2 sprays into both nostrils daily. 16 g 2  . Multiple Vitamins-Minerals (MULTIVITAMIN WITH MINERALS) tablet Take 1 tablet by mouth daily. Organic with co enzymes and probiotic    . Omega-3 Fatty Acids (FISH OIL) 1000 MG CAPS Take 1,000 mg by mouth 2 (two) times daily.    . ondansetron (ZOFRAN ODT) 4 MG disintegrating tablet Take 1 tablet (4 mg total) by mouth every 8 (eight) hours as needed for nausea or vomiting. 20 tablet 0  . ondansetron (ZOFRAN ODT) 8 MG disintegrating tablet Take 1 tablet (8 mg total) by mouth every 8 (eight) hours as needed for nausea or vomiting. 20 tablet 0  . pantoprazole (PROTONIX) 40 MG tablet TAKE 1 TABLET BY MOUTH EVERY DAY 90 tablet 1  . Polyethylene Glycol 3350 (MIRALAX PO) Take by mouth as needed.    . predniSONE (DELTASONE) 10 MG tablet 3 tabs x3 days and then 2 tabs x3 days and then 1 tab x3 days.  Take w/ food. 18 tablet 0  . promethazine-dextromethorphan (PROMETHAZINE-DM) 6.25-15 MG/5ML syrup Take 5 mLs by mouth 4 (four) times daily as needed. 240 mL 0  . TURMERIC PO Take 400 mg by mouth.    Marland Kitchen UNABLE TO FIND Med Name: Zannie Cove Extract 1278m BID    . vitamin E 1000 UNIT capsule  Take 1,000 Units by mouth daily.     No current facility-administered medications on file prior to visit.     Review of Systems:  As per HPI- otherwise negative. No fever or chills No rash    Physical Examination: Vitals:   11/24/17 1507  BP: 126/88  Pulse: (!) 101  Resp: 16  Temp: 98.2 F (36.8 C)  SpO2: 98%   Vitals:   11/24/17 1507  Weight: 222 lb (100.7 kg)  Height: 5' 4"  (1.626 m)   Body mass index is 38.11 kg/m. Ideal Body Weight: Weight in (lb) to have BMI = 25:  145.3  GEN: WDWN, NAD, Non-toxic, A & O x 3, obese, looks well  HEENT: Atraumatic, Normocephalic. Neck supple. No masses, No LAD.  Bilateral TM wnl, oropharynx normal.  PEERL,EOMI.   Ears and Nose: No external deformity. CV: RRR, No M/G/R. No JVD. No thrill. No extra heart sounds. PULM: CTA B, no wheezes, crackles, rhonchi. No retractions. No resp. distress. No accessory muscle use. ABD: S, NT, ND, +BS. No rebound. No HSM. EXTR: No c/c/e NEURO Normal gait.  PSYCH: Normally interactive. Conversant. Not depressed or anxious appearing.  Calm demeanor.    Assessment and Plan: Bronchitis - Plan: azithromycin (ZITHROMAX) 250 MG tablet  Here today with sinus sx that have now developed into chest congestion for 2+ weeks Will start her on azithromycin and she will let me know if not feeling better in the next few days-Sooner if worse.    Signed Lamar Blinks, MD

## 2018-02-17 DIAGNOSIS — Z1231 Encounter for screening mammogram for malignant neoplasm of breast: Secondary | ICD-10-CM | POA: Diagnosis not present

## 2018-02-17 DIAGNOSIS — Z01419 Encounter for gynecological examination (general) (routine) without abnormal findings: Secondary | ICD-10-CM | POA: Diagnosis not present

## 2018-02-17 DIAGNOSIS — Z6838 Body mass index (BMI) 38.0-38.9, adult: Secondary | ICD-10-CM | POA: Diagnosis not present

## 2018-02-21 ENCOUNTER — Encounter: Payer: Self-pay | Admitting: Family Medicine

## 2018-04-06 ENCOUNTER — Encounter: Payer: Self-pay | Admitting: Family Medicine

## 2018-04-07 ENCOUNTER — Other Ambulatory Visit: Payer: Self-pay | Admitting: Family Medicine

## 2018-04-13 ENCOUNTER — Encounter: Payer: Self-pay | Admitting: Family Medicine

## 2018-04-13 NOTE — Telephone Encounter (Signed)
Mel Almond, see this information for her FMLA paperwork

## 2018-04-17 ENCOUNTER — Encounter: Payer: Self-pay | Admitting: Family Medicine

## 2018-04-21 NOTE — Telephone Encounter (Signed)
Received [employer] [place] ST[employer] paperwork, completed as much as possible; forwarded to provider/SLS 03/27

## 2018-05-23 ENCOUNTER — Encounter: Payer: Self-pay | Admitting: Family Medicine

## 2018-05-26 ENCOUNTER — Other Ambulatory Visit: Payer: Self-pay | Admitting: Family Medicine

## 2018-07-10 DIAGNOSIS — R5382 Chronic fatigue, unspecified: Secondary | ICD-10-CM | POA: Diagnosis not present

## 2018-07-10 DIAGNOSIS — M2550 Pain in unspecified joint: Secondary | ICD-10-CM | POA: Diagnosis not present

## 2018-07-10 DIAGNOSIS — M0609 Rheumatoid arthritis without rheumatoid factor, multiple sites: Secondary | ICD-10-CM | POA: Diagnosis not present

## 2018-07-10 DIAGNOSIS — R21 Rash and other nonspecific skin eruption: Secondary | ICD-10-CM | POA: Diagnosis not present

## 2018-07-11 LAB — LAB REPORT - SCANNED
Calcium: 8.9
EGFR: 114

## 2018-10-03 ENCOUNTER — Telehealth: Payer: Self-pay

## 2018-10-03 NOTE — Telephone Encounter (Signed)
PA initiated via Covermymeds; KEY: [id number]. PA approved. Effective 09/03/2018 to 10/03/2019.

## 2018-11-09 ENCOUNTER — Other Ambulatory Visit: Payer: Self-pay | Admitting: Family Medicine

## 2018-11-13 ENCOUNTER — Other Ambulatory Visit: Payer: Self-pay | Admitting: Family Medicine

## 2018-11-17 ENCOUNTER — Other Ambulatory Visit: Payer: Self-pay

## 2018-11-17 ENCOUNTER — Ambulatory Visit (INDEPENDENT_AMBULATORY_CARE_PROVIDER_SITE_OTHER): Payer: Federal, State, Local not specified - PPO

## 2018-11-17 DIAGNOSIS — Z23 Encounter for immunization: Secondary | ICD-10-CM | POA: Diagnosis not present

## 2018-12-05 ENCOUNTER — Other Ambulatory Visit: Payer: Self-pay | Admitting: Family Medicine

## 2018-12-31 ENCOUNTER — Other Ambulatory Visit: Payer: Self-pay | Admitting: Family Medicine

## 2019-01-01 ENCOUNTER — Other Ambulatory Visit: Payer: Self-pay | Admitting: Family Medicine

## 2019-01-03 ENCOUNTER — Other Ambulatory Visit: Payer: Self-pay

## 2019-01-03 NOTE — Progress Notes (Addendum)
[facility] [address], [place], [place] [address] [phone]  Date:  01/04/2019   Name:  Christina Parker   DOB:  08-May-1975   MRN:  [id number]  PCP:  Christina Mclean, MD    Chief Complaint: Annual Exam   History of Present Illness:  Christina Parker is a 43 y.o. very pleasant female patient who presents with the following:  Here today for complete physical Last seen by myself about 1 year ago for sick visit History of rheumatoid arthritis, GERD, obesity, reactive airway disease/asthma She is a patient at [facility] rheumatology-she is using Humira and Celebrex  Pap is up-to-date Mammogram- done per her GYN, she sees Dr Christina Parker  Routine labs are due-we will get today  humira- working well for her  protonix Albuterol prn celebrex  I take care of her mom Christina Parker as well- Christina Parker is concerned about her mom's memory  We spent some time talking about this, and made a plan for follow-up  Otherwise Christina Parker is feeling fine, admits she has not been exercising much since he got so cold outside.  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 Rheumatoid arthritis ([place]) 08/05/2016  . Routine general medical examination at a health care facility 09/03/2015  . Arthralgia 09/01/2015  . GERD (gastroesophageal reflux disease) 05/24/2014  . Morbid obesity ([place]) 05/24/2014  . MVP (mitral valve prolapse) 01/16/2014  . Reactive airway disease 12/07/2013    Past Medical History:  Diagnosis Date  . Anemia   . Asthma    exercise induction and seonal allerggies  casuse flars  . Constipation   . Family history of anesthesia complication    pt's daughter has PO N&V  . GERD (gastroesophageal reflux disease)   . Heart murmur    MVP  . History of vitamin B deficiency   . History of vitamin D deficiency   . MVP (mitral valve prolapse)   . Pneumonia   . Shortness of breath dyspnea    with abdominal bloating since Jan 2017- "gallbladder"  . Vaginal delivery 1997, 2005    Past Surgical History:  Procedure Laterality Date  . CHOLECYSTECTOMY N/A 04/24/2015   Procedure: LAPAROSCOPIC CHOLECYSTECTOMY WITH INTRAOPERATIVE CHOLANGIOGRAM;  Surgeon: Erroll Luna, MD;  Location: [facility];  Service: General;  Laterality: N/A;  . DILITATION & CURRETTAGE/HYSTROSCOPY WITH NOVASURE ABLATION N/A 08/02/2013   Procedure: DILATATION & CURETTAGE/HYSTEROSCOPY WITH NOVASURE ABLATION;  Surgeon: Marylynn Pearson, MD;  Location: [facility];  Service: Gynecology;  Laterality: N/A;  . TUBAL LIGATION  2005    Social History   Tobacco Use  . Smoking status: Passive Smoke Exposure - Never Smoker  . Smokeless tobacco: Never Used  . Tobacco comment: Exposed x 11 years. Pt works in home health  Substance Use Topics  . Alcohol use: No    Alcohol/week: 0.0 standard drinks  . Drug use: No    Family History  Problem Relation Age of Onset  . Diabetes Sister   . Diabetes Maternal  Grandmother   . Hypertension Maternal Grandmother   . Hypertension Maternal Grandfather   . Diabetes Paternal Grandmother   . Hypertension Paternal Grandmother   . Diabetes Paternal Grandfather   . Hypertension Paternal Grandfather   . Asthma Son   . Allergies Mother   . Allergies Son     No Known Allergies  Medication list has been reviewed and updated.  Current Outpatient Medications on File Prior to Visit  Medication Sig Dispense Refill  . Adalimumab (HUMIRA) 40 MG/0.4ML PSKT Inject into the skin.    Marland Kitchen albuterol (PROVENTIL HFA;VENTOLIN HFA) 108 (90 BASE) MCG/ACT inhaler Inhale 2 puffs into the  lungs every 4 (four) hours as needed for wheezing or shortness of breath (cough, shortness of breath or wheezing.). 1 Inhaler 1  . celecoxib (CELEBREX) 200 MG capsule Take 1 capsule by mouth 2 (two) times daily.  2  . cetirizine (ZYRTEC ALLERGY) 10 MG tablet Take 10 mg by mouth daily.    . Cholecalciferol (VITAMIN D) 2000 UNITS tablet Take 2,000 Units by mouth daily.    Marland Kitchen CINNAMON PO Take 1 tablet by mouth daily.    . Cyanocobalamin (B-12) 3000 MCG CAPS Take 3,000 mg by mouth daily.    Marland Kitchen FIBER PO Take 1 tablet by mouth daily.    . fluticasone (FLONASE) 50 MCG/ACT nasal spray Place 2 sprays into both nostrils daily. 16 g 2  . Multiple Vitamins-Minerals (MULTIVITAMIN WITH MINERALS) tablet Take 1 tablet by mouth daily. Organic with co enzymes and probiotic    . Omega-3 Fatty Acids (FISH OIL) 1000 MG CAPS Take 1,000 mg by mouth 2 (two) times daily.    . pantoprazole (PROTONIX) 40 MG tablet TAKE 1 TABLET BY MOUTH EVERY DAY 14 tablet 0  . Polyethylene Glycol 3350 (MIRALAX PO) Take by mouth as needed.    . TURMERIC PO Take 400 mg by mouth.    Marland Kitchen UNABLE TO FIND Med Name: Zannie [place] Extract 1213m BID    . vitamin E 1000 UNIT capsule Take 1,000 Units by mouth daily.     No current facility-administered medications on file prior to visit.    Review of Systems:  As per HPI- otherwise negative. No fever or chills, no chest pain or shortness of breath  Physical Examination: Vitals:   01/04/19 1509  BP: 127/78  Pulse: 83  Resp: 12  Temp: 97.6 F (36.4 C)  SpO2: 100%   Vitals:   01/04/19 1509  Weight: 232 lb 3.2 oz (105.3 kg)  Height: 5' 4"  (1.626 m)   Body mass index is 39.86 kg/m. Ideal Body Weight: Weight in (lb) to have BMI = 25: 145.3  GEN: WDWN, NAD, Non-toxic, A & O x 3, obese, looks well HEENT: Atraumatic, Normocephalic. Neck supple. No masses, No LAD.  TM within normal limits bilaterally Ears and Nose: No external deformity. CV: RRR, No M/G/R. No JVD. No thrill. No extra heart  sounds. PULM: CTA B, no wheezes, crackles, rhonchi. No retractions. No resp. distress. No accessory muscle use. ABD: S, NT, ND, +BS. No rebound. No HSM. EXTR: No c/c/e NEURO Normal gait.  PSYCH: Normally interactive. Conversant. Not depressed or anxious appearing.  Calm demeanor.    Assessment and Plan: Screening for hyperlipidemia - Plan: Lipid panel  Pre-diabetes - Plan: Comprehensive metabolic panel, Hemoglobin A1c  Morbid obesity ([place]  Medication monitoring encounter  Screening for thyroid disorder - Plan: TSH  Physical exam - Plan: CBC  Gastroesophageal reflux disease, unspecified whether esophagitis present - Plan:  pantoprazole (PROTONIX) 40 MG tablet  Here today for complete physical.  I will request GYN records, continue to follow-up with rheumatology for rheumatoid arthritis Routine screening labs pending as above Refilled Protonix Immunizations up-to-date Will plan further follow- up pending labs.  This visit occurred during the SARS-CoV-2 public health emergency.  Safety protocols were in place, including screening questions prior to the visit, additional usage of staff PPE, and extensive cleaning of exam room while observing appropriate contact time as indicated for disinfecting solutions.    Signed Lamar Blinks, MD  Addendum 12/11, received her labs as below Message to patient  Results for orders placed or performed in visit on 01/04/19  CBC  Result Value Ref Range   WBC 10.4 4.0 - 10.5 K/uL   RBC 4.57 3.87 - 5.11 Mil/uL   Platelets 345.0 150.0 - 400.0 K/uL   Hemoglobin 12.7 12.0 - 15.0 g/dL   HCT 39.1 36.0 - 46.0 %   MCV 85.6 78.0 - 100.0 fl   MCHC 32.4 30.0 - 36.0 g/dL   RDW 14.6 11.5 - 15.5 %  Comprehensive metabolic panel  Result Value Ref Range   Sodium 139 135 - 145 mEq/L   Potassium 3.5 3.5 - 5.1 mEq/L   Chloride 104 96 - 112 mEq/L   CO2 24 19 - 32 mEq/L   Glucose, Bld 91 70 - 99 mg/dL   BUN 16 6 - 23 mg/dL   Creatinine, Ser 0.77 0.40 -  1.20 mg/dL   Total Bilirubin 0.4 0.2 - 1.2 mg/dL   Alkaline Phosphatase 68 39 - 117 U/L   AST 14 0 - 37 U/L   ALT 15 0 - 35 U/L   Total Protein 7.3 6.0 - 8.3 g/dL   Albumin 3.8 3.5 - 5.2 g/dL   GFR 99.26 >60.00 mL/min   Calcium 9.2 8.4 - 10.5 mg/dL  Hemoglobin A1c  Result Value Ref Range   Hgb A1c MFr Bld 5.8 4.6 - 6.5 %  Lipid panel  Result Value Ref Range   Cholesterol 129 0 - 200 mg/dL   Triglycerides 136.0 0.0 - 149.0 mg/dL   HDL 34.80 (L) >39.00 mg/dL   VLDL 27.2 0.0 - 40.0 mg/dL   LDL Cholesterol 67 0 - 99 mg/dL   Total CHOL/HDL Ratio 4    NonHDL 94.48   TSH  Result Value Ref Range   TSH 1.25 0.35 - 4.50 uIU/mL

## 2019-01-03 NOTE — Patient Instructions (Addendum)
Good to see you again today, I will be in touch with your labs ASAP I will request a copy of your last pap and mammo from Dr Julien Girt   I am glad to see your mom again at any point-    Health Maintenance, Female Adopting a healthy lifestyle and getting preventive care are important in promoting health and wellness. Ask your health care provider about:  The right schedule for you to have regular tests and exams.  Things you can do on your own to prevent diseases and keep yourself healthy. What should I know about diet, weight, and exercise? Eat a healthy diet   Eat a diet that includes plenty of vegetables, fruits, low-fat dairy products, and lean protein.  Do not eat a lot of foods that are high in solid fats, added sugars, or sodium. Maintain a healthy weight Body mass index (BMI) is used to identify weight problems. It estimates body fat based on height and weight. Your health care provider can help determine your BMI and help you achieve or maintain a healthy weight. Get regular exercise Get regular exercise. This is one of the most important things you can do for your health. Most adults should:  Exercise for at least 150 minutes each week. The exercise should increase your heart rate and make you sweat (moderate-intensity exercise).  Do strengthening exercises at least twice a week. This is in addition to the moderate-intensity exercise.  Spend less time sitting. Even light physical activity can be beneficial. Watch cholesterol and blood lipids Have your blood tested for lipids and cholesterol at 42 years of age, then have this test every 5 years. Have your cholesterol levels checked more often if:  Your lipid or cholesterol levels are high.  You are older than 42 years of age.  You are at high risk for heart disease. What should I know about cancer screening? Depending on your health history and family history, you may need to have cancer screening at various ages. This may  include screening for:  Breast cancer.  Cervical cancer.  Colorectal cancer.  Skin cancer.  Lung cancer. What should I know about heart disease, diabetes, and high blood pressure? Blood pressure and heart disease  High blood pressure causes heart disease and increases the risk of stroke. This is more likely to develop in people who have high blood pressure readings, are of African descent, or are overweight.  Have your blood pressure checked: ? Every 3-5 years if you are 22-49 years of age. ? Every year if you are 95 years old or older. Diabetes Have regular diabetes screenings. This checks your fasting blood sugar level. Have the screening done:  Once every three years after age 53 if you are at a normal weight and have a low risk for diabetes.  More often and at a younger age if you are overweight or have a high risk for diabetes. What should I know about preventing infection? Hepatitis B If you have a higher risk for hepatitis B, you should be screened for this virus. Talk with your health care provider to find out if you are at risk for hepatitis B infection. Hepatitis C Testing is recommended for:  Everyone born from [birth year] through 1964.  Anyone with known risk factors for hepatitis C. Sexually transmitted infections (STIs)  Get screened for STIs, including gonorrhea and chlamydia, if: ? You are sexually active and are younger than 42 years of age. ? You are older than 42 years of  age and your health care provider tells you that you are at risk for this type of infection. ? Your sexual activity has changed since you were last screened, and you are at increased risk for chlamydia or gonorrhea. Ask your health care provider if you are at risk.  Ask your health care provider about whether you are at high risk for HIV. Your health care provider may recommend a prescription medicine to help prevent HIV infection. If you choose to take medicine to prevent HIV, you should first  get tested for HIV. You should then be tested every 3 months for as long as you are taking the medicine. Pregnancy  If you are about to stop having your period (premenopausal) and you may become pregnant, seek counseling before you get pregnant.  Take 400 to 800 micrograms (mcg) of folic acid every day if you become pregnant.  Ask for birth control (contraception) if you want to prevent pregnancy. Osteoporosis and menopause Osteoporosis is a disease in which the bones lose minerals and strength with aging. This can result in bone fractures. If you are 71 years old or older, or if you are at risk for osteoporosis and fractures, ask your health care provider if you should:  Be screened for bone loss.  Take a calcium or vitamin D supplement to lower your risk of fractures.  Be given hormone replacement therapy (HRT) to treat symptoms of menopause. Follow these instructions at home: Lifestyle  Do not use any products that contain nicotine or tobacco, such as cigarettes, e-cigarettes, and chewing tobacco. If you need help quitting, ask your health care provider.  Do not use street drugs.  Do not share needles.  Ask your health care provider for help if you need support or information about quitting drugs. Alcohol use  Do not drink alcohol if: ? Your health care provider tells you not to drink. ? You are pregnant, may be pregnant, or are planning to become pregnant.  If you drink alcohol: ? Limit how much you use to 0-1 drink a day. ? Limit intake if you are breastfeeding.  Be aware of how much alcohol is in your drink. In the [place] one drink equals one 12 oz bottle of beer (355 mL), one 5 oz glass of wine (148 mL), or one 1 oz glass of hard liquor (44 mL). General instructions  Schedule regular health, dental, and eye exams.  Stay current with your vaccines.  Tell your health care provider if: ? You often feel depressed. ? You have ever been abused or do not feel safe at  home. Summary  Adopting a healthy lifestyle and getting preventive care are important in promoting health and wellness.  Follow your health care provider's instructions about healthy diet, exercising, and getting tested or screened for diseases.  Follow your health care provider's instructions on monitoring your cholesterol and blood pressure. This information is not intended to replace advice given to you by your health care provider. Make sure you discuss any questions you have with your health care provider. Document Released: 07/27/2010 Document Revised: 01/04/2018 Document Reviewed: 01/04/2018 Elsevier Patient Education  2020 [employer].

## 2019-01-04 ENCOUNTER — Ambulatory Visit: Payer: Federal, State, Local not specified - PPO | Admitting: Family Medicine

## 2019-01-04 ENCOUNTER — Ambulatory Visit (INDEPENDENT_AMBULATORY_CARE_PROVIDER_SITE_OTHER): Payer: Federal, State, Local not specified - PPO | Admitting: Family Medicine

## 2019-01-04 ENCOUNTER — Encounter: Payer: Self-pay | Admitting: Family Medicine

## 2019-01-04 VITALS — BP 127/78 | HR 83 | Temp 97.6°F | Resp 12 | Ht 64.0 in | Wt 232.2 lb

## 2019-01-04 DIAGNOSIS — Z13220 Encounter for screening for lipoid disorders: Secondary | ICD-10-CM

## 2019-01-04 DIAGNOSIS — Z5181 Encounter for therapeutic drug level monitoring: Secondary | ICD-10-CM | POA: Diagnosis not present

## 2019-01-04 DIAGNOSIS — Z0000 Encounter for general adult medical examination without abnormal findings: Secondary | ICD-10-CM

## 2019-01-04 DIAGNOSIS — R7303 Prediabetes: Secondary | ICD-10-CM

## 2019-01-04 DIAGNOSIS — Z1329 Encounter for screening for other suspected endocrine disorder: Secondary | ICD-10-CM | POA: Diagnosis not present

## 2019-01-04 DIAGNOSIS — K219 Gastro-esophageal reflux disease without esophagitis: Secondary | ICD-10-CM

## 2019-01-04 MED ORDER — PANTOPRAZOLE SODIUM 40 MG PO TBEC: 40.0000 mg | DELAYED_RELEASE_TABLET | Freq: Every day | ORAL | 3 refills | Status: DC

## 2019-01-05 ENCOUNTER — Encounter: Payer: Self-pay | Admitting: Family Medicine

## 2019-01-05 LAB — LIPID PANEL
Cholesterol: 129 mg/dL (ref 0–200)
HDL: 34.8 mg/dL — ABNORMAL LOW (ref >39.00)
LDL Cholesterol: 67 mg/dL (ref 0–99)
NonHDL: 94.48
Total CHOL/HDL Ratio: 4
Triglycerides: 136 mg/dL (ref 0.0–149.0)
VLDL: 27.2 mg/dL (ref 0.0–40.0)

## 2019-01-05 LAB — TSH: TSH: 1.25 u[IU]/mL (ref 0.35–4.50)

## 2019-01-05 LAB — CBC
HCT: 39.1 % (ref 36.0–46.0)
Hemoglobin: 12.7 g/dL (ref 12.0–15.0)
MCHC: 32.4 g/dL (ref 30.0–36.0)
MCV: 85.6 fl (ref 78.0–100.0)
Platelets: 345 10*3/uL (ref 150.0–400.0)
RBC: 4.57 Mil/uL (ref 3.87–5.11)
RDW: 14.6 % (ref 11.5–15.5)
WBC: 10.4 10*3/uL (ref 4.0–10.5)

## 2019-01-05 LAB — COMPREHENSIVE METABOLIC PANEL
ALT: 15 U/L (ref 0–35)
AST: 14 U/L (ref 0–37)
Albumin: 3.8 g/dL (ref 3.5–5.2)
Alkaline Phosphatase: 68 U/L (ref 39–117)
BUN: 16 mg/dL (ref 6–23)
CO2: 24 mEq/L (ref 19–32)
Calcium: 9.2 mg/dL (ref 8.4–10.5)
Chloride: 104 mEq/L (ref 96–112)
Creatinine, Ser: 0.77 mg/dL (ref 0.40–1.20)
GFR: 99.26 mL/min (ref >60.00)
Glucose, Bld: 91 mg/dL (ref 70–99)
Potassium: 3.5 mEq/L (ref 3.5–5.1)
Sodium: 139 mEq/L (ref 135–145)
Total Bilirubin: 0.4 mg/dL (ref 0.2–1.2)
Total Protein: 7.3 g/dL (ref 6.0–8.3)

## 2019-01-05 LAB — HEMOGLOBIN A1C: Hgb A1c MFr Bld: 5.8 % (ref 4.6–6.5)

## 2019-01-09 DIAGNOSIS — M0609 Rheumatoid arthritis without rheumatoid factor, multiple sites: Secondary | ICD-10-CM | POA: Diagnosis not present

## 2019-01-09 DIAGNOSIS — M2550 Pain in unspecified joint: Secondary | ICD-10-CM | POA: Diagnosis not present

## 2019-01-09 DIAGNOSIS — R5382 Chronic fatigue, unspecified: Secondary | ICD-10-CM | POA: Diagnosis not present

## 2019-01-09 DIAGNOSIS — R21 Rash and other nonspecific skin eruption: Secondary | ICD-10-CM | POA: Diagnosis not present

## 2019-03-20 DIAGNOSIS — R7303 Prediabetes: Secondary | ICD-10-CM | POA: Insufficient documentation

## 2019-03-20 NOTE — Progress Notes (Addendum)
[facility] [place], [place], [place] [address] [phone]  Date:  03/21/2019   Name:  Christina Parker   DOB:  22-Jan-1976   MRN:  [id number]  PCP:  Darreld Mclean, MD    Chief Complaint: Abdominal Pain (left lower abdominal pain, one week)   History of Present Illness:  Christina Parker is a 44 y.o. 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, currently working remotely  Patient of [facility] rheumatology, taking Humira and Celebrex  Mammogram Labs done December  She notes pain in her belly over the last week or so; she often has abdominal discomfort, but this seems different than her usual symptoms She is currently using Protonix, Pepcid, tylenol and Pepto- bismol She tends to have GERD and constipation at baseline She is having pain now in her LLQ - worse over the last week No vomiting No fever noted No urinary sx No vaginal sx  Never dx with diverticulitis  She is s/p chole and BTL, endometrial ablation   Patient Active Problem List   Diagnosis Date Noted  . Prediabetes 03/20/2019  . Rheumatoid arthritis ([place]) 08/05/2016  . Routine general medical examination at a health care facility 09/03/2015  . Arthralgia 09/01/2015  . GERD (gastroesophageal reflux disease) 05/24/2014  . Morbid obesity ([place]) 05/24/2014  . MVP (mitral valve prolapse) 01/16/2014  . Reactive airway disease 12/07/2013    Past Medical History:  Diagnosis Date  . Anemia   . Asthma    exercise induction and seonal allerggies casuse flars  . Constipation   . Family history of anesthesia complication    pt's daughter has PO N&V  . GERD (gastroesophageal reflux disease)   . Heart murmur     MVP  . History of vitamin B deficiency   . History of vitamin D deficiency   . MVP (mitral valve prolapse)   . Pneumonia   . Shortness of breath dyspnea    with abdominal bloating since Jan 2017- "gallbladder"  . Vaginal delivery 1997, 2005    Past Surgical History:  Procedure Laterality Date  . CHOLECYSTECTOMY N/A 04/24/2015   Procedure: LAPAROSCOPIC CHOLECYSTECTOMY WITH INTRAOPERATIVE CHOLANGIOGRAM;  Surgeon: Erroll Luna, MD;  Location: [facility];  Service: General;  Laterality: N/A;  . DILITATION & CURRETTAGE/HYSTROSCOPY WITH NOVASURE ABLATION N/A 08/02/2013   Procedure: DILATATION & CURETTAGE/HYSTEROSCOPY WITH NOVASURE ABLATION;  Surgeon: Marylynn Pearson, MD;  Location: [facility];  Service: Gynecology;  Laterality: N/A;  . TUBAL LIGATION  2005    Social History   Tobacco Use  . Smoking status: Passive Smoke Exposure - Never Smoker  . Smokeless tobacco: Never Used  . Tobacco comment: Exposed x 11 years. Pt works in home health  Substance Use Topics  . Alcohol use: No    Alcohol/week: 0.0 standard drinks  . Drug use: No    Family History  Problem Relation Age of Onset  . Diabetes Sister   . Diabetes Maternal Grandmother   . Hypertension Maternal Grandmother   . Hypertension Maternal Grandfather   . Diabetes Paternal Grandmother   . Hypertension Paternal Grandmother   . Diabetes Paternal Grandfather   . Hypertension Paternal Grandfather   . Asthma Son   .  Allergies Mother   . Allergies Son     No Known Allergies  Medication list has been reviewed and updated.  Current Outpatient Medications on File Prior to Visit  Medication Sig Dispense Refill  . Adalimumab (HUMIRA) 40 MG/0.4ML PSKT Inject into the skin.    Marland Kitchen albuterol (PROVENTIL HFA;VENTOLIN HFA) 108 (90 BASE) MCG/ACT inhaler Inhale 2 puffs into the lungs every 4 (four) hours as needed for wheezing or shortness of breath (cough, shortness of breath or wheezing.). 1 Inhaler 1  . celecoxib (CELEBREX) 200 MG capsule  Take 1 capsule by mouth 2 (two) times daily.  2  . cetirizine (ZYRTEC ALLERGY) 10 MG tablet Take 10 mg by mouth daily.    . Cholecalciferol (VITAMIN D) 2000 UNITS tablet Take 2,000 Units by mouth daily.    . Cyanocobalamin (B-12) 3000 MCG CAPS Take 3,000 mg by mouth daily.    Marland Kitchen FIBER PO Take 1 tablet by mouth daily.    . fluticasone (FLONASE) 50 MCG/ACT nasal spray Place 2 sprays into both nostrils daily. 16 g 2  . Multiple Vitamins-Minerals (MULTIVITAMIN WITH MINERALS) tablet Take 1 tablet by mouth daily. Organic with co enzymes and probiotic    . Omega-3 Fatty Acids (FISH OIL) 1000 MG CAPS Take 1,000 mg by mouth 2 (two) times daily.    . pantoprazole (PROTONIX) 40 MG tablet Take 1 tablet (40 mg total) by mouth daily. Use as needed for GERD 90 tablet 3  . Polyethylene Glycol 3350 (MIRALAX PO) Take by mouth as needed.    . TURMERIC PO Take 400 mg by mouth.    Marland Kitchen UNABLE TO FIND Med Name: Zannie Cove Extract 1271m BID    . vitamin E 1000 UNIT capsule Take 1,000 Units by mouth daily.     No current facility-administered medications on file prior to visit.    Review of Systems:  As per HPI- otherwise negative.   Physical Examination: Vitals:   03/21/19 1356  BP: 128/80  Pulse: 95  Resp: 17  Temp: (!) 97.1 F (36.2 C)  SpO2: 97%   Vitals:   03/21/19 1356  Weight: 233 lb (105.7 kg)  Height: 5' 4"  (1.626 m)   Body mass index is 39.99 kg/m. Ideal Body Weight: Weight in (lb) to have BMI = 25: 145.3  GEN: no acute distress.  Obese, looks well HEENT: Atraumatic, Normocephalic.  Ears and Nose: No external deformity. CV: RRR, No M/G/R. No JVD. No thrill. No extra heart sounds. PULM: CTA B, no wheezes, crackles, rhonchi. No retractions. No resp. distress. No accessory muscle use. ABD: S, ND, +BS. No rebound. No HSM.  Mild left lower quadrant tenderness is present on exam EXTR: No c/c/e PSYCH: Normally interactive. Conversant.  Pelvic exam is benign, no cervical motion tenderness or  adnexal masses or tenderness, vaginal vault and cervix normal  Results for orders placed or performed in visit on 03/21/19  POCT urine pregnancy  Result Value Ref Range   Preg Test, Ur Negative Negative    Assessment and Plan: LLQ pain - Plan: POCT urine pregnancy, CBC, Comprehensive metabolic panel  Prediabetes  Left lower quadrant abdominal pain - Plan: CT Abdomen Pelvis W Contrast  Here today with left lower quadrant pain suspicious for diverticulitis.  Pregnancy has been ruled out.  We will check a CBC and CMP today, patient would like to proceed with CT scan.  We will plan to complete CT today and I will be in touch with her pending results  Moderate medical  decision making today  This visit occurred during the SARS-CoV-2 public health emergency.  Safety protocols were in place, including screening questions prior to the visit, additional usage of staff PPE, and extensive cleaning of exam room while observing appropriate contact time as indicated for disinfecting solutions.    Signed Lamar Blinks, MD  Received her CT scan  CT Abdomen Pelvis W Contrast  Result Date: 03/21/2019 CLINICAL DATA:  Left lower quadrant pain. EXAM: CT ABDOMEN AND PELVIS WITH CONTRAST TECHNIQUE: Multidetector CT imaging of the abdomen and pelvis was performed using the standard protocol following bolus administration of intravenous contrast. CONTRAST:  152m OMNIPAQUE IOHEXOL 350 MG/ML SOLN COMPARISON:  Abdominal ultrasound 03/28/2015. FINDINGS: Lower chest: Unremarkable Hepatobiliary: Liver measures 22.1 cm craniocaudal length, enlarged. No suspicious focal abnormality within the liver parenchyma. Gallbladder is surgically absent. No intrahepatic or extrahepatic biliary dilation. Pancreas: No focal mass lesion. No dilatation of the main duct. No intraparenchymal cyst. No peripancreatic edema. Spleen: No splenomegaly. No focal mass lesion. Adrenals/Urinary Tract: No adrenal nodule or mass. Right kidney  unremarkable. 13 mm cyst noted upper pole left kidney similar in size to ultrasound of 03/28/2015. No evidence for hydroureter. The urinary bladder appears normal for the degree of distention. Stomach/Bowel: Stomach is unremarkable. No gastric wall thickening. No evidence of outlet obstruction. Duodenum is normally positioned as is the ligament of Treitz. No small bowel wall thickening. No small bowel dilatation. The terminal ileum is normal. The appendix is best seen on coronal images and is unremarkable. No gross colonic mass. No colonic wall thickening. Vascular/Lymphatic: No abdominal aortic aneurysm. No abdominal aortic atherosclerotic calcification. There is no gastrohepatic or hepatoduodenal ligament lymphadenopathy. No retroperitoneal or mesenteric lymphadenopathy. No pelvic sidewall lymphadenopathy. No pelvic sidewall lymphadenopathy. Reproductive: Multiple uterine fibroids noted. There is no adnexal mass. Other: No intraperitoneal free fluid. Musculoskeletal: No worrisome lytic or sclerotic osseous abnormality. IMPRESSION: 1. No acute findings in the abdomen or pelvis. Specifically, no findings to explain the patient's history of left lower quadrant pain. No diverticulitis. No left adnexal mass. 2. Fibroid uterus. 3. Hepatomegaly. Electronically Signed   By: EMisty StanleyM.D.   On: 03/21/2019 17:18   Called pt- CT is ok, no explanation for pain Will not start abx Referral to GI for further eval She will let me know if getting worse or changing Did suggest bland, low residue diet for a few days in any case    Addendum 2/25, received her labs.  Message to patient  Results for orders placed or performed in visit on 03/21/19  CBC  Result Value Ref Range   WBC 9.9 4.0 - 10.5 K/uL   RBC 4.73 3.87 - 5.11 Mil/uL   Platelets 390.0 150.0 - 400.0 K/uL   Hemoglobin 13.3 12.0 - 15.0 g/dL   HCT 40.1 36.0 - 46.0 %   MCV 84.8 78.0 - 100.0 fl   MCHC 33.3 30.0 - 36.0 g/dL   RDW 14.3 11.5 - 15.5 %   Comprehensive metabolic panel  Result Value Ref Range   Sodium 140 135 - 145 mEq/L   Potassium 3.6 3.5 - 5.1 mEq/L   Chloride 102 96 - 112 mEq/L   CO2 26 19 - 32 mEq/L   Glucose, Bld 83 70 - 99 mg/dL   BUN 14 6 - 23 mg/dL   Creatinine, Ser 0.79 0.40 - 1.20 mg/dL   Total Bilirubin 0.4 0.2 - 1.2 mg/dL   Alkaline Phosphatase 67 39 - 117 U/L   AST 17 0 - 37  U/L   ALT 17 0 - 35 U/L   Total Protein 7.7 6.0 - 8.3 g/dL   Albumin 4.0 3.5 - 5.2 g/dL   GFR 96.27 >60.00 mL/min   Calcium 9.6 8.4 - 10.5 mg/dL  POCT urine pregnancy  Result Value Ref Range   Preg Test, Ur Negative Negative

## 2019-03-21 ENCOUNTER — Encounter: Payer: Self-pay | Admitting: Family Medicine

## 2019-03-21 ENCOUNTER — Other Ambulatory Visit: Payer: Self-pay

## 2019-03-21 ENCOUNTER — Ambulatory Visit: Payer: Federal, State, Local not specified - PPO | Admitting: Family Medicine

## 2019-03-21 ENCOUNTER — Ambulatory Visit (HOSPITAL_BASED_OUTPATIENT_CLINIC_OR_DEPARTMENT_OTHER)
Admission: RE | Admit: 2019-03-21 | Discharge: 2019-03-21 | Disposition: A | Discharge status: Home / Self Care | Payer: Federal, State, Local not specified - PPO | Source: Ambulatory Visit | Attending: Family Medicine | Admitting: Family Medicine

## 2019-03-21 VITALS — BP 128/80 | HR 95 | Temp 97.1°F | Resp 17 | Ht 64.0 in | Wt 233.0 lb

## 2019-03-21 DIAGNOSIS — R7303 Prediabetes: Secondary | ICD-10-CM

## 2019-03-21 DIAGNOSIS — R1032 Left lower quadrant pain: Secondary | ICD-10-CM | POA: Diagnosis not present

## 2019-03-21 DIAGNOSIS — D259 Leiomyoma of uterus, unspecified: Secondary | ICD-10-CM | POA: Diagnosis not present

## 2019-03-21 LAB — POCT URINE PREGNANCY: Preg Test, Ur: NEGATIVE

## 2019-03-21 MED ORDER — IOHEXOL 350 MG/ML SOLN
100.0000 mL | Freq: Once | INTRAVENOUS | Status: AC | PRN
  Administered 2019-03-21: 100 mL via INTRAVENOUS

## 2019-03-21 NOTE — Patient Instructions (Signed)
I suspect you have diverticulitis Please go to the ground floor to have a CT scan done at the imaging dept.  After you scan you can head home- I will be in touch with your report   Assuming you do have diverticulitis we will plan to start augmentin and a "low residue" soft diet for a few days until better

## 2019-03-21 NOTE — Addendum Note (Signed)
Addended by: Lamar Blinks C on: 03/21/2019 05:55 PM   Modules accepted: Orders

## 2019-03-22 ENCOUNTER — Encounter: Payer: Self-pay | Admitting: Family Medicine

## 2019-03-22 LAB — CBC
HCT: 40.1 % (ref 36.0–46.0)
Hemoglobin: 13.3 g/dL (ref 12.0–15.0)
MCHC: 33.3 g/dL (ref 30.0–36.0)
MCV: 84.8 fl (ref 78.0–100.0)
Platelets: 390 10*3/uL (ref 150.0–400.0)
RBC: 4.73 Mil/uL (ref 3.87–5.11)
RDW: 14.3 % (ref 11.5–15.5)
WBC: 9.9 10*3/uL (ref 4.0–10.5)

## 2019-03-22 LAB — COMPREHENSIVE METABOLIC PANEL
ALT: 17 U/L (ref 0–35)
AST: 17 U/L (ref 0–37)
Albumin: 4 g/dL (ref 3.5–5.2)
Alkaline Phosphatase: 67 U/L (ref 39–117)
BUN: 14 mg/dL (ref 6–23)
CO2: 26 mEq/L (ref 19–32)
Calcium: 9.6 mg/dL (ref 8.4–10.5)
Chloride: 102 mEq/L (ref 96–112)
Creatinine, Ser: 0.79 mg/dL (ref 0.40–1.20)
GFR: 96.27 mL/min (ref >60.00)
Glucose, Bld: 83 mg/dL (ref 70–99)
Potassium: 3.6 mEq/L (ref 3.5–5.1)
Sodium: 140 mEq/L (ref 135–145)
Total Bilirubin: 0.4 mg/dL (ref 0.2–1.2)
Total Protein: 7.7 g/dL (ref 6.0–8.3)

## 2019-03-22 MED ORDER — AMOXICILLIN-POT CLAVULANATE 875-125 MG PO TABS: 1.0000 | ORAL_TABLET | Freq: Two times a day (BID) | ORAL | 0 refills | Status: DC

## 2019-03-22 NOTE — Addendum Note (Signed)
Addended by: Darreld Mclean on: 03/22/2019 08:17 PM   Modules accepted: Orders

## 2019-03-23 DIAGNOSIS — R1032 Left lower quadrant pain: Secondary | ICD-10-CM | POA: Diagnosis not present

## 2019-03-23 DIAGNOSIS — R7889 Finding of other specified substances, not normally found in blood: Secondary | ICD-10-CM | POA: Diagnosis not present

## 2019-03-23 DIAGNOSIS — R971 Elevated cancer antigen 125 [CA 125]: Secondary | ICD-10-CM | POA: Diagnosis not present

## 2019-03-26 DIAGNOSIS — R1032 Left lower quadrant pain: Secondary | ICD-10-CM | POA: Diagnosis not present

## 2019-03-26 DIAGNOSIS — R102 Pelvic and perineal pain: Secondary | ICD-10-CM | POA: Diagnosis not present

## 2019-03-27 ENCOUNTER — Ambulatory Visit: Payer: Federal, State, Local not specified - PPO | Admitting: Nurse Practitioner

## 2019-04-09 DIAGNOSIS — J45909 Unspecified asthma, uncomplicated: Secondary | ICD-10-CM | POA: Diagnosis not present

## 2019-04-09 DIAGNOSIS — Z801 Family history of malignant neoplasm of trachea, bronchus and lung: Secondary | ICD-10-CM | POA: Diagnosis not present

## 2019-04-09 DIAGNOSIS — R160 Hepatomegaly, not elsewhere classified: Secondary | ICD-10-CM | POA: Diagnosis not present

## 2019-04-09 DIAGNOSIS — Z9851 Tubal ligation status: Secondary | ICD-10-CM | POA: Diagnosis not present

## 2019-04-09 DIAGNOSIS — K219 Gastro-esophageal reflux disease without esophagitis: Secondary | ICD-10-CM | POA: Diagnosis not present

## 2019-04-09 DIAGNOSIS — Z79899 Other long term (current) drug therapy: Secondary | ICD-10-CM | POA: Diagnosis not present

## 2019-04-09 DIAGNOSIS — R109 Unspecified abdominal pain: Secondary | ICD-10-CM | POA: Diagnosis not present

## 2019-04-09 DIAGNOSIS — Z6837 Body mass index (BMI) 37.0-37.9, adult: Secondary | ICD-10-CM | POA: Diagnosis not present

## 2019-04-09 DIAGNOSIS — Z539 Procedure and treatment not carried out, unspecified reason: Secondary | ICD-10-CM | POA: Diagnosis not present

## 2019-04-09 DIAGNOSIS — Z9049 Acquired absence of other specified parts of digestive tract: Secondary | ICD-10-CM | POA: Diagnosis not present

## 2019-04-09 DIAGNOSIS — D219 Benign neoplasm of connective and other soft tissue, unspecified: Secondary | ICD-10-CM | POA: Diagnosis not present

## 2019-04-09 DIAGNOSIS — M4802 Spinal stenosis, cervical region: Secondary | ICD-10-CM | POA: Diagnosis not present

## 2019-04-09 DIAGNOSIS — D259 Leiomyoma of uterus, unspecified: Secondary | ICD-10-CM | POA: Diagnosis not present

## 2019-04-09 DIAGNOSIS — R971 Elevated cancer antigen 125 [CA 125]: Secondary | ICD-10-CM | POA: Diagnosis not present

## 2019-04-20 DIAGNOSIS — D251 Intramural leiomyoma of uterus: Secondary | ICD-10-CM | POA: Diagnosis not present

## 2019-04-20 DIAGNOSIS — R102 Pelvic and perineal pain: Secondary | ICD-10-CM | POA: Diagnosis not present

## 2019-04-20 DIAGNOSIS — N857 Hematometra: Secondary | ICD-10-CM | POA: Diagnosis not present

## 2019-04-24 DIAGNOSIS — D251 Intramural leiomyoma of uterus: Secondary | ICD-10-CM | POA: Diagnosis not present

## 2019-04-24 DIAGNOSIS — M069 Rheumatoid arthritis, unspecified: Secondary | ICD-10-CM | POA: Diagnosis not present

## 2019-04-24 DIAGNOSIS — N858 Other specified noninflammatory disorders of uterus: Secondary | ICD-10-CM | POA: Diagnosis not present

## 2019-04-24 DIAGNOSIS — D259 Leiomyoma of uterus, unspecified: Secondary | ICD-10-CM | POA: Diagnosis not present

## 2019-04-27 ENCOUNTER — Encounter: Payer: Self-pay | Admitting: Family Medicine

## 2019-05-03 DIAGNOSIS — N838 Other noninflammatory disorders of ovary, fallopian tube and broad ligament: Secondary | ICD-10-CM | POA: Diagnosis not present

## 2019-05-03 DIAGNOSIS — N83292 Other ovarian cyst, left side: Secondary | ICD-10-CM | POA: Diagnosis not present

## 2019-05-03 DIAGNOSIS — R971 Elevated cancer antigen 125 [CA 125]: Secondary | ICD-10-CM | POA: Diagnosis not present

## 2019-05-03 DIAGNOSIS — R102 Pelvic and perineal pain: Secondary | ICD-10-CM | POA: Diagnosis not present

## 2019-05-06 ENCOUNTER — Encounter: Payer: Self-pay | Admitting: Family Medicine

## 2019-05-11 DIAGNOSIS — R1900 Intra-abdominal and pelvic swelling, mass and lump, unspecified site: Secondary | ICD-10-CM | POA: Diagnosis not present

## 2019-05-11 DIAGNOSIS — Z01818 Encounter for other preprocedural examination: Secondary | ICD-10-CM | POA: Diagnosis not present

## 2019-06-12 DIAGNOSIS — D251 Intramural leiomyoma of uterus: Secondary | ICD-10-CM | POA: Diagnosis not present

## 2019-06-12 DIAGNOSIS — D252 Subserosal leiomyoma of uterus: Secondary | ICD-10-CM | POA: Diagnosis not present

## 2019-06-12 DIAGNOSIS — R1900 Intra-abdominal and pelvic swelling, mass and lump, unspecified site: Secondary | ICD-10-CM | POA: Diagnosis not present

## 2019-06-12 DIAGNOSIS — R971 Elevated cancer antigen 125 [CA 125]: Secondary | ICD-10-CM | POA: Diagnosis not present

## 2019-06-12 DIAGNOSIS — Z20822 Contact with and (suspected) exposure to covid-19: Secondary | ICD-10-CM | POA: Diagnosis not present

## 2019-06-12 DIAGNOSIS — Z01818 Encounter for other preprocedural examination: Secondary | ICD-10-CM | POA: Diagnosis not present

## 2019-06-14 DIAGNOSIS — D252 Subserosal leiomyoma of uterus: Secondary | ICD-10-CM | POA: Diagnosis not present

## 2019-06-14 DIAGNOSIS — D250 Submucous leiomyoma of uterus: Secondary | ICD-10-CM | POA: Diagnosis not present

## 2019-06-14 DIAGNOSIS — R7303 Prediabetes: Secondary | ICD-10-CM | POA: Diagnosis not present

## 2019-06-14 DIAGNOSIS — D259 Leiomyoma of uterus, unspecified: Secondary | ICD-10-CM | POA: Diagnosis not present

## 2019-06-14 DIAGNOSIS — Z6838 Body mass index (BMI) 38.0-38.9, adult: Secondary | ICD-10-CM | POA: Diagnosis not present

## 2019-06-14 DIAGNOSIS — Z7951 Long term (current) use of inhaled steroids: Secondary | ICD-10-CM | POA: Diagnosis not present

## 2019-06-14 DIAGNOSIS — R1900 Intra-abdominal and pelvic swelling, mass and lump, unspecified site: Secondary | ICD-10-CM | POA: Diagnosis not present

## 2019-06-14 DIAGNOSIS — J45909 Unspecified asthma, uncomplicated: Secondary | ICD-10-CM | POA: Diagnosis not present

## 2019-06-14 DIAGNOSIS — M069 Rheumatoid arthritis, unspecified: Secondary | ICD-10-CM | POA: Diagnosis not present

## 2019-06-14 DIAGNOSIS — N8312 Corpus luteum cyst of left ovary: Secondary | ICD-10-CM | POA: Diagnosis not present

## 2019-06-14 DIAGNOSIS — D251 Intramural leiomyoma of uterus: Secondary | ICD-10-CM | POA: Diagnosis not present

## 2019-06-14 DIAGNOSIS — K219 Gastro-esophageal reflux disease without esophagitis: Secondary | ICD-10-CM | POA: Diagnosis not present

## 2019-06-14 DIAGNOSIS — N7011 Chronic salpingitis: Secondary | ICD-10-CM | POA: Diagnosis not present

## 2019-06-14 DIAGNOSIS — N838 Other noninflammatory disorders of ovary, fallopian tube and broad ligament: Secondary | ICD-10-CM | POA: Diagnosis not present

## 2019-07-17 DIAGNOSIS — R21 Rash and other nonspecific skin eruption: Secondary | ICD-10-CM | POA: Diagnosis not present

## 2019-07-17 DIAGNOSIS — R5382 Chronic fatigue, unspecified: Secondary | ICD-10-CM | POA: Diagnosis not present

## 2019-07-17 DIAGNOSIS — M2550 Pain in unspecified joint: Secondary | ICD-10-CM | POA: Diagnosis not present

## 2019-07-17 DIAGNOSIS — M0609 Rheumatoid arthritis without rheumatoid factor, multiple sites: Secondary | ICD-10-CM | POA: Diagnosis not present

## 2019-07-17 DIAGNOSIS — Z111 Encounter for screening for respiratory tuberculosis: Secondary | ICD-10-CM | POA: Diagnosis not present

## 2019-09-13 DIAGNOSIS — M62838 Other muscle spasm: Secondary | ICD-10-CM | POA: Diagnosis not present

## 2019-09-13 DIAGNOSIS — D251 Intramural leiomyoma of uterus: Secondary | ICD-10-CM | POA: Diagnosis not present

## 2019-09-13 DIAGNOSIS — D252 Subserosal leiomyoma of uterus: Secondary | ICD-10-CM | POA: Diagnosis not present

## 2019-09-13 DIAGNOSIS — Z9889 Other specified postprocedural states: Secondary | ICD-10-CM | POA: Diagnosis not present

## 2019-11-03 NOTE — Progress Notes (Addendum)
[place] [address] [place],  [address] [phone]  Date:  11/07/2019   Name:  Christina Parker   DOB:  September 18, 1975   MRN:  [id number]  PCP:  Darreld Mclean, MD    Chief Complaint: Anxiety   History of Present Illness:  Christina Parker is a 44 y.o. very pleasant female patient who presents with the following:  Patient here today for follow-up Last seen by myself in February of this year - at that time she had pelvic pain, CT normal.  She saw her GYN and had an elevated Ca-125- however, this was not thought to represent malignancy.    She had a hysterectomy in May per [facility].  Her pathology was apparently negative - however pt feels that she did not have all the pathology work that was discussed.  She had some uterine tumors which it appears were examined grossly and not sent for slides- this is what she expected to be done.  She is feeling afraid that she might have cancer and no know it History of RA, pre-diabetes, obesity, RAD- Humira per [facility] rheumatology for her RA Mammogram- this will be done through GYN Flu shot -  Give today   She has an appt with Julien Girt in January for follow-up She would like to speak to a counselor- at this time she is not so much interested in taking medication   She is sleeping well- too well, she is feeling tired and lethargic She has little energy,feels angry.   Admits that she is snappish and irritable at home and work.  She is just really upset   Patient Active Problem List   Diagnosis Date Noted   Prediabetes 03/20/2019   Rheumatoid arthritis ([place]) 08/05/2016   Routine general medical examination at a health care facility 09/03/2015   Arthralgia 09/01/2015   GERD (gastroesophageal reflux disease) 05/24/2014   Morbid obesity ([place]) 05/24/2014   MVP (mitral valve prolapse) 01/16/2014   Reactive airway disease 12/07/2013    Past Medical History:  Diagnosis Date    Anemia    Asthma    exercise induction and seonal allerggies casuse flars   Constipation    Family history of anesthesia complication    pt's daughter has PO N&V   GERD (gastroesophageal reflux disease)    Heart murmur    MVP   History of vitamin B deficiency    History of vitamin D deficiency    MVP (mitral valve prolapse)    Pneumonia    Shortness of breath dyspnea    with abdominal bloating since Jan 2017- "gallbladder"   Vaginal delivery 1997, 2005    Past Surgical History:  Procedure Laterality Date   CHOLECYSTECTOMY N/A 04/24/2015   Procedure: LAPAROSCOPIC CHOLECYSTECTOMY WITH INTRAOPERATIVE CHOLANGIOGRAM;  Surgeon: Erroll Luna, MD;  Location: [facility];  Service: General;  Laterality: N/A;   [place] N/A 08/02/2013   Procedure: DILATATION & CURETTAGE/HYSTEROSCOPY WITH NOVASURE ABLATION;  Surgeon: Marylynn Pearson, MD;  Location: [facility];  Service: Gynecology;  Laterality: N/A;   TUBAL LIGATION  2005    Social History   Tobacco Use   Smoking status: Passive Smoke Exposure - Never Smoker   Smokeless tobacco: Never Used   Tobacco comment: Exposed x 11 years. Pt works in home health  Substance Use Topics   Alcohol use: No    Alcohol/week: 0.0 standard drinks   Drug use: No  Family History  Problem Relation Age of Onset   Diabetes Sister    Diabetes Maternal Grandmother    Hypertension Maternal Grandmother    Hypertension Maternal Grandfather    Diabetes Paternal Grandmother    Hypertension Paternal Grandmother    Diabetes Paternal Grandfather    Hypertension Paternal Grandfather    Asthma Son    Allergies Mother    Allergies Son     No Known Allergies  Medication list has been reviewed and updated.  Current Outpatient Medications on File Prior to Visit  Medication Sig Dispense Refill   Adalimumab (HUMIRA) 40 MG/0.4ML PSKT Inject into the skin.     albuterol (PROVENTIL HFA;VENTOLIN HFA) 108 (90 BASE)  MCG/ACT inhaler Inhale 2 puffs into the lungs every 4 (four) hours as needed for wheezing or shortness of breath (cough, shortness of breath or wheezing.). 1 Inhaler 1   celecoxib (CELEBREX) 200 MG capsule Take 1 capsule by mouth 2 (two) times daily.  2   cetirizine (ZYRTEC ALLERGY) 10 MG tablet Take 10 mg by mouth daily.     fluticasone (FLONASE) 50 MCG/ACT nasal spray Place 2 sprays into both nostrils daily. 16 g 2   pantoprazole (PROTONIX) 40 MG tablet Take 1 tablet (40 mg total) by mouth daily. Use as needed for GERD 90 tablet 3   amoxicillin-clavulanate (AUGMENTIN) 875-125 MG tablet Take 1 tablet by mouth 2 (two) times daily. 20 tablet 0   Cholecalciferol (VITAMIN D) 2000 UNITS tablet Take 2,000 Units by mouth daily.     Cyanocobalamin (B-12) 3000 MCG CAPS Take 3,000 mg by mouth daily.     FIBER PO Take 1 tablet by mouth daily.     Multiple Vitamins-Minerals (MULTIVITAMIN WITH MINERALS) tablet Take 1 tablet by mouth daily. Organic with co enzymes and probiotic     Omega-3 Fatty Acids (FISH OIL) 1000 MG CAPS Take 1,000 mg by mouth 2 (two) times daily.     Polyethylene Glycol 3350 (MIRALAX PO) Take by mouth as needed.     TURMERIC PO Take 400 mg by mouth.     UNABLE TO FIND Med Name: Zannie Cove Extract 1225m BID     vitamin E 1000 UNIT capsule Take 1,000 Units by mouth daily.     No current facility-administered medications on file prior to visit.    Review of Systems:  As per HPI- otherwise negative.   Physical Examination: Vitals:   11/07/19 1414 11/07/19 1419  BP:  124/74  Pulse: 87 87  Resp: 12 13  Temp: 99 F (37.2 C) 99 F (37.2 C)  SpO2: 98% 98%   Vitals:   11/07/19 1419  Weight: 241 lb 9.6 oz (109.6 kg)  Height: 5' 5"  (1.651 m)   Body mass index is 40.2 kg/m. Ideal Body Weight: Weight in (lb) to have BMI = 25: 149.9  GEN: no acute distress.  Overweight, otherwise appears well although upset  HEENT: Atraumatic, Normocephalic.  Ears and Nose: No external  deformity. CV: RRR, No M/G/R. No JVD. No thrill. No extra heart sounds. PULM: CTA B, no wheezes, crackles, rhonchi. No retractions. No resp. distress. No accessory muscle use. EXTR: No c/c/e PSYCH: Normally interactive. Conversant.    Assessment and Plan:  Acute stress reaction  Prediabetes  Rheumatoid arthritis, involving unspecified site, unspecified whether rheumatoid factor present ([place]  Screening for hyperlipidemia - Plan: Lipid panel  Screening for thyroid disorder - Plan: TSH  Pre-diabetes - Plan: Comprehensive metabolic panel, Hemoglobin A1c  Other fatigue - Plan: CBC  Influenza vaccine needed - Plan: Flu Vaccine QUAD 6+ mos PF IM (Fluarix Quad PF)  Pt is feeling very stressed and upset about a recent situation with gyn-onc at [facility].  I am not clear on the specific oncologic details, but it seems she planned to have a hysterectomy with specific path eval of 4 different uterine tumors, but this was not done. There is just a gross path report from her uterus and pathology for washings on her [facility] chart. She is worried that she may have a cancer that was not diagnosed.  She is losing her temper at home and work, having symptoms of depression since she learned about her path work up.   At this time she would like to seek counseling as opposed to starting any medication for depression.  She has a specific person she would like to see with [facility]  I will also get in touch with her primary GYN who I know personally and see if she can help [place] understand if anything further is needed for her eval.  I advised pt that I am more than happy to keep working with her until we make sure all is clear from a cancer standpoint   Flu shot given today  This visit occurred during the SARS-CoV-2 public health emergency.  Safety protocols were in place, including screening questions prior to the visit, additional usage of staff PPE, and extensive cleaning of exam room while observing appropriate  contact time as indicated for disinfecting solutions.     Signed Lamar Blinks, MD  Received her labs 10/14- message to pt  Results for orders placed or performed in visit on 11/07/19  CBC  Result Value Ref Range   WBC 9.6 3.8 - 10.8 Thousand/uL   RBC 4.94 3.80 - 5.10 Million/uL   Hemoglobin 13.5 11.7 - 15.5 g/dL   HCT 40.7 35 - 45 %   MCV 82.4 80.0 - 100.0 fL   MCH 27.3 27.0 - 33.0 pg   MCHC 33.2 32.0 - 36.0 g/dL   RDW 13.3 11.0 - 15.0 %   Platelets 355 140 - 400 Thousand/uL   MPV 9.9 7.5 - 12.5 fL  Comprehensive metabolic panel  Result Value Ref Range   Glucose, Bld 88 65 - 99 mg/dL   BUN 12 7 - 25 mg/dL   Creat 0.70 0.50 - 1.10 mg/dL   BUN/Creatinine Ratio NOT APPLICABLE 6 - 22 (calc)   Sodium 142 135 - 146 mmol/L   Potassium 3.5 3.5 - 5.3 mmol/L   Chloride 104 98 - 110 mmol/L   CO2 27 20 - 32 mmol/L   Calcium 9.3 8.6 - 10.2 mg/dL   Total Protein 7.7 6.1 - 8.1 g/dL   Albumin 4.0 3.6 - 5.1 g/dL   Globulin 3.7 1.9 - 3.7 g/dL (calc)   AG Ratio 1.1 1.0 - 2.5 (calc)   Total Bilirubin 0.3 0.2 - 1.2 mg/dL   Alkaline phosphatase (APISO) 77 31 - 125 U/L   AST 27 10 - 30 U/L   ALT 21 6 - 29 U/L  TSH  Result Value Ref Range   TSH 1.32 mIU/L  Hemoglobin A1c  Result Value Ref Range   Hgb A1c MFr Bld 6.6 (H) <5.7 % of total Hgb   Mean Plasma Glucose 143 (calc)   eAG (mmol/L) 7.9 (calc)  Lipid panel  Result Value Ref Range   Cholesterol 142 <200 mg/dL   HDL 36 (L) > OR = 50 mg/dL   Triglycerides 154 (H) <  150 mg/dL   LDL Cholesterol (Calc) 80 mg/dL (calc)   Total CHOL/HDL Ratio 3.9 <5.0 (calc)   Non-HDL Cholesterol (Calc) 106 <130 mg/dL (calc)

## 2019-11-07 ENCOUNTER — Other Ambulatory Visit: Payer: Self-pay

## 2019-11-07 ENCOUNTER — Encounter: Payer: Self-pay | Admitting: Family Medicine

## 2019-11-07 ENCOUNTER — Ambulatory Visit: Payer: Federal, State, Local not specified - PPO | Admitting: Family Medicine

## 2019-11-07 VITALS — BP 124/74 | HR 87 | Temp 99.0°F | Resp 13 | Ht 65.0 in | Wt 241.6 lb

## 2019-11-07 DIAGNOSIS — F430 Acute stress reaction: Secondary | ICD-10-CM

## 2019-11-07 DIAGNOSIS — R5383 Other fatigue: Secondary | ICD-10-CM | POA: Diagnosis not present

## 2019-11-07 DIAGNOSIS — R7303 Prediabetes: Secondary | ICD-10-CM

## 2019-11-07 DIAGNOSIS — M069 Rheumatoid arthritis, unspecified: Secondary | ICD-10-CM | POA: Diagnosis not present

## 2019-11-07 DIAGNOSIS — Z13220 Encounter for screening for lipoid disorders: Secondary | ICD-10-CM | POA: Diagnosis not present

## 2019-11-07 DIAGNOSIS — Z23 Encounter for immunization: Secondary | ICD-10-CM | POA: Diagnosis not present

## 2019-11-07 DIAGNOSIS — Z1329 Encounter for screening for other suspected endocrine disorder: Secondary | ICD-10-CM

## 2019-11-07 NOTE — Patient Instructions (Addendum)
I agree that seeing a counselor may be helpful for you.  Please let me know if you would like to talk about using some medication  You mentioned seeing Caroline Sauger with [facility]  Phone: [place]: [phone] Fax: [phone]  I will touch base with Dr Julien Girt and see if she can help [place] figure out your situation

## 2019-11-08 ENCOUNTER — Ambulatory Visit: Payer: Federal, State, Local not specified - PPO

## 2019-11-08 ENCOUNTER — Encounter: Payer: Self-pay | Admitting: Family Medicine

## 2019-11-08 LAB — CBC
HCT: 40.7 % (ref 35.0–45.0)
Hemoglobin: 13.5 g/dL (ref 11.7–15.5)
MCH: 27.3 pg (ref 27.0–33.0)
MCHC: 33.2 g/dL (ref 32.0–36.0)
MCV: 82.4 fL (ref 80.0–100.0)
MPV: 9.9 fL (ref 7.5–12.5)
Platelets: 355 10*3/uL (ref 140–400)
RBC: 4.94 10*6/uL (ref 3.80–5.10)
RDW: 13.3 % (ref 11.0–15.0)
WBC: 9.6 10*3/uL (ref 3.8–10.8)

## 2019-11-08 LAB — LIPID PANEL
Cholesterol: 142 mg/dL (ref <200)
HDL: 36 mg/dL — ABNORMAL LOW (ref >=50)
LDL Cholesterol (Calc): 80 mg/dL (calc)
Non-HDL Cholesterol (Calc): 106 mg/dL (calc) (ref <130)
Total CHOL/HDL Ratio: 3.9 (calc) (ref <5.0)
Triglycerides: 154 mg/dL — ABNORMAL HIGH (ref <150)

## 2019-11-08 LAB — COMPREHENSIVE METABOLIC PANEL
AG Ratio: 1.1 (calc) (ref 1.0–2.5)
ALT: 21 U/L (ref 6–29)
AST: 27 U/L (ref 10–30)
Albumin: 4 g/dL (ref 3.6–5.1)
Alkaline phosphatase (APISO): 77 U/L (ref 31–125)
BUN: 12 mg/dL (ref 7–25)
CO2: 27 mmol/L (ref 20–32)
Calcium: 9.3 mg/dL (ref 8.6–10.2)
Chloride: 104 mmol/L (ref 98–110)
Creat: 0.7 mg/dL (ref 0.50–1.10)
Globulin: 3.7 g/dL (calc) (ref 1.9–3.7)
Glucose, Bld: 88 mg/dL (ref 65–99)
Potassium: 3.5 mmol/L (ref 3.5–5.3)
Sodium: 142 mmol/L (ref 135–146)
Total Bilirubin: 0.3 mg/dL (ref 0.2–1.2)
Total Protein: 7.7 g/dL (ref 6.1–8.1)

## 2019-11-08 LAB — HEMOGLOBIN A1C
Hgb A1c MFr Bld: 6.6 % of total Hgb — ABNORMAL HIGH (ref <5.7)
Mean Plasma Glucose: 143 (calc)
eAG (mmol/L): 7.9 (calc)

## 2019-11-08 LAB — TSH: TSH: 1.32 mIU/L

## 2019-11-11 ENCOUNTER — Encounter: Payer: Self-pay | Admitting: Family Medicine

## 2019-11-12 ENCOUNTER — Telehealth: Payer: Self-pay

## 2019-11-12 NOTE — Telephone Encounter (Signed)
Effective 10/13/2019 to 11/11/2020.

## 2019-11-12 NOTE — Telephone Encounter (Signed)
PA initiated via Covermymeds; KEY: [id number]. PA approved.

## 2019-11-15 DIAGNOSIS — M62838 Other muscle spasm: Secondary | ICD-10-CM | POA: Diagnosis not present

## 2019-11-15 DIAGNOSIS — M6281 Muscle weakness (generalized): Secondary | ICD-10-CM | POA: Diagnosis not present

## 2019-11-15 DIAGNOSIS — R29898 Other symptoms and signs involving the musculoskeletal system: Secondary | ICD-10-CM | POA: Diagnosis not present

## 2019-11-27 ENCOUNTER — Other Ambulatory Visit: Payer: Self-pay | Admitting: Obstetrics and Gynecology

## 2019-11-27 DIAGNOSIS — Z1231 Encounter for screening mammogram for malignant neoplasm of breast: Secondary | ICD-10-CM

## 2019-12-13 ENCOUNTER — Encounter (HOSPITAL_BASED_OUTPATIENT_CLINIC_OR_DEPARTMENT_OTHER): Payer: Self-pay

## 2019-12-13 ENCOUNTER — Other Ambulatory Visit: Payer: Self-pay

## 2019-12-13 ENCOUNTER — Emergency Department (HOSPITAL_BASED_OUTPATIENT_CLINIC_OR_DEPARTMENT_OTHER)
Admission: EM | Admit: 2019-12-13 | Discharge: 2019-12-13 | Disposition: A | Discharge status: Home / Self Care | Payer: Federal, State, Local not specified - PPO | Attending: Emergency Medicine | Admitting: Emergency Medicine

## 2019-12-13 DIAGNOSIS — J45909 Unspecified asthma, uncomplicated: Secondary | ICD-10-CM | POA: Insufficient documentation

## 2019-12-13 DIAGNOSIS — R059 Cough, unspecified: Secondary | ICD-10-CM | POA: Diagnosis not present

## 2019-12-13 DIAGNOSIS — Z20822 Contact with and (suspected) exposure to covid-19: Secondary | ICD-10-CM | POA: Diagnosis not present

## 2019-12-13 DIAGNOSIS — Z7951 Long term (current) use of inhaled steroids: Secondary | ICD-10-CM | POA: Diagnosis not present

## 2019-12-13 DIAGNOSIS — J029 Acute pharyngitis, unspecified: Secondary | ICD-10-CM | POA: Insufficient documentation

## 2019-12-13 DIAGNOSIS — Z9851 Tubal ligation status: Secondary | ICD-10-CM | POA: Insufficient documentation

## 2019-12-13 LAB — RESP PANEL BY RT-PCR (FLU A&B, COVID) ARPGX2
Influenza A by PCR: NEGATIVE
Influenza B by PCR: NEGATIVE
SARS Coronavirus 2 by RT PCR: NEGATIVE

## 2019-12-13 MED ORDER — AMOXICILLIN 875 MG PO TABS: 875.0000 mg | ORAL_TABLET | Freq: Two times a day (BID) | ORAL | 0 refills | Status: DC

## 2019-12-13 NOTE — ED Triage Notes (Signed)
Pt c/o URI and cough x 5 days-NAD-steady gait

## 2019-12-13 NOTE — Discharge Instructions (Signed)
Your covid test is pending

## 2019-12-13 NOTE — ED Provider Notes (Signed)
[facility] EMERGENCY DEPARTMENT Provider Note   CSN: [id number] Arrival date & time: 12/13/19  1658     History Chief Complaint  Patient presents with  . URI    Christina Parker is a 44 y.o. female.  Pt reports her  The history is provided by the patient. No language interpreter was used.  URI Presenting symptoms: fever and sore throat   Severity:  Moderate Onset quality:  Gradual Timing:  Constant Progression:  Worsening Chronicity:  New Relieved by:  Nothing Worsened by:  Nothing Ineffective treatments:  None tried Risk factors: sick contacts        Past Medical History:  Diagnosis Date  . Anemia   . Asthma    exercise induction and seonal allerggies casuse flars  . Constipation   . Family history of anesthesia complication    pt's daughter has PO N&V  . GERD (gastroesophageal reflux disease)   . Heart murmur    MVP  . History of vitamin B deficiency   . History of vitamin D deficiency   . MVP (mitral valve prolapse)   . Pneumonia   . Shortness of breath dyspnea    with abdominal bloating since Jan 2017- "gallbladder"  . Vaginal delivery 1997, 2005    Patient Active Problem List   Diagnosis Date Noted  . Prediabetes 03/20/2019  . Rheumatoid arthritis ([place]) 08/05/2016  . Routine general medical examination at a health care facility 09/03/2015  . Arthralgia 09/01/2015  . GERD (gastroesophageal reflux disease) 05/24/2014  . Morbid obesity ([place]) 05/24/2014  . MVP (mitral valve prolapse) 01/16/2014  . Reactive airway disease 12/07/2013    Past Surgical History:  Procedure Laterality Date  . ABDOMINAL HYSTERECTOMY    . CHOLECYSTECTOMY N/A 04/24/2015   Procedure: LAPAROSCOPIC CHOLECYSTECTOMY WITH INTRAOPERATIVE CHOLANGIOGRAM;  Surgeon: Erroll Luna, MD;  Location: [facility];  Service: General;  Laterality: N/A;  . DILITATION & CURRETTAGE/HYSTROSCOPY WITH NOVASURE ABLATION N/A 08/02/2013   Procedure: DILATATION & CURETTAGE/HYSTEROSCOPY WITH  NOVASURE ABLATION;  Surgeon: Marylynn Pearson, MD;  Location: [facility];  Service: Gynecology;  Laterality: N/A;  . TUBAL LIGATION  2005     OB History   No obstetric history on file.     Family History  Problem Relation Age of Onset  . Diabetes Sister   . Diabetes Maternal Grandmother   . Hypertension Maternal Grandmother   . Hypertension Maternal Grandfather   . Diabetes Paternal Grandmother   . Hypertension Paternal Grandmother   . Diabetes Paternal Grandfather   . Hypertension Paternal Grandfather   . Asthma Son   . Allergies Mother   . Allergies Son     Social History   Tobacco Use  . Smoking status: Never Smoker  . Smokeless tobacco: Never Used  . Tobacco comment: Exposed x 11 years. Pt works in Chemical engineer  . Vaping Use: Never used  Substance Use Topics  . Alcohol use: No    Alcohol/week: 0.0 standard drinks  . Drug use: No    Home Medications Prior to Admission medications   Medication Sig Start Date End Date Taking? Authorizing Provider  Adalimumab (HUMIRA) 40 MG/0.4ML PSKT Inject into the skin.    [provider]  albuterol (PROVENTIL HFA;VENTOLIN HFA) 108 (90 BASE) MCG/ACT inhaler Inhale 2 puffs into the lungs every 4 (four) hours as needed for wheezing or shortness of breath (cough, shortness of breath or wheezing.). 12/07/13   Roselee Culver, MD  amoxicillin (AMOXIL) 875 MG tablet Take  1 tablet (875 mg total) by mouth 2 (two) times daily. 12/13/19   Fransico Meadow, PA-C  celecoxib (CELEBREX) 200 MG capsule Take 1 capsule by mouth 2 (two) times daily. 06/23/16   [provider]  cetirizine (ZYRTEC ALLERGY) 10 MG tablet Take 10 mg by mouth daily.    [provider]  Cholecalciferol (VITAMIN D) 2000 UNITS tablet Take 2,000 Units by mouth daily.    [provider]  Cyanocobalamin (B-12) 3000 MCG CAPS Take 3,000 mg by mouth daily.    [provider]  FIBER PO Take 1 tablet by mouth daily.    [provider]  fluticasone (FLONASE) 50 MCG/ACT nasal spray Place 2 sprays into both nostrils daily. 02/18/14   Chesley Mires, MD  Multiple Vitamins-Minerals (MULTIVITAMIN WITH MINERALS) tablet Take 1 tablet by mouth daily. Organic with co enzymes and probiotic    [provider]  Omega-3 Fatty Acids (FISH OIL) 1000 MG CAPS Take 1,000 mg by mouth 2 (two) times daily.    [provider]  pantoprazole (PROTONIX) 40 MG tablet Take 1 tablet (40 mg total) by mouth daily. Use as needed for GERD 01/04/19   Copland, Gay Filler, MD  Polyethylene Glycol 3350 (MIRALAX PO) Take by mouth as needed.    [provider]  TURMERIC PO Take 400 mg by mouth.    [provider]  UNABLE TO FIND Med Name: Zannie [place] Extract 1249m BID    [provider]  vitamin E 1000 UNIT capsule Take 1,000 Units by mouth daily.    [provider]    Allergies    Patient has no known allergies.  Review of Systems   Review of Systems  Constitutional: Positive for fever.  HENT: Positive for sore throat.   All other systems reviewed and are negative.   Physical Exam Updated Vital Signs BP 128/86   Pulse 78   Temp 98.8 F (37.1 C) (Oral)   Resp 18   Ht 5' 7"  (1.702 m)   Wt 110.7 kg   LMP 07/08/2013   SpO2 99%   BMI 38.22 kg/m   Physical Exam Vitals and nursing note reviewed.  Constitutional:      Appearance: She is well-developed.  HENT:     Head: Normocephalic.     Mouth/Throat:     Mouth: Mucous membranes are moist.     Pharynx: Posterior oropharyngeal erythema present.  Pulmonary:     Effort: Pulmonary effort is normal.  Abdominal:     General: There is no distension.  Musculoskeletal:        General: Normal range of motion.     Cervical back: Normal range of motion.  Skin:    General: Skin is warm.  Neurological:     Mental Status: She is alert and oriented to person, place, and time.  Psychiatric:        Mood and Affect: Mood normal.     ED  Results / Procedures / Treatments   Labs (all labs ordered are listed, but only abnormal results are displayed) Labs Reviewed  RESP PANEL BY RT-PCR (FLU A&B, COVID) ARPGX2    EKG None  Radiology No results found.  Procedures Procedures (including critical care time)  Medications Ordered in ED Medications - No data to display  ED Course  I have reviewed the triage vital signs and the nursing notes.  Pertinent labs & imaging results that were available during my care of the patient were reviewed by me  and considered in my medical decision making (see chart for details).    MDM Rules/Calculators/A&P                          covid is negative.  I will treat for strep due to two family memmbers having Final Clinical Impression(s) / ED Diagnoses Final diagnoses:  Pharyngitis, unspecified etiology    Rx / DC Orders ED Discharge Orders         Ordered    amoxicillin (AMOXIL) 875 MG tablet  2 times daily        12/13/19 1749        An After Visit Summary was printed and given to the patient.    Fransico Meadow, Hershal Coria 12/13/19 1852    Drenda Freeze, MD 12/13/19 [phone]

## 2019-12-27 DIAGNOSIS — M6281 Muscle weakness (generalized): Secondary | ICD-10-CM | POA: Diagnosis not present

## 2019-12-27 DIAGNOSIS — M62838 Other muscle spasm: Secondary | ICD-10-CM | POA: Diagnosis not present

## 2019-12-27 DIAGNOSIS — R29898 Other symptoms and signs involving the musculoskeletal system: Secondary | ICD-10-CM | POA: Diagnosis not present

## 2020-01-04 ENCOUNTER — Other Ambulatory Visit: Payer: Self-pay

## 2020-01-04 ENCOUNTER — Ambulatory Visit
Admission: RE | Admit: 2020-01-04 | Discharge: 2020-01-04 | Disposition: A | Discharge status: Home / Self Care | Payer: Federal, State, Local not specified - PPO | Source: Ambulatory Visit | Attending: Obstetrics and Gynecology | Admitting: Obstetrics and Gynecology

## 2020-01-04 DIAGNOSIS — Z1231 Encounter for screening mammogram for malignant neoplasm of breast: Secondary | ICD-10-CM

## 2020-01-10 DIAGNOSIS — M9902 Segmental and somatic dysfunction of thoracic region: Secondary | ICD-10-CM | POA: Diagnosis not present

## 2020-01-10 DIAGNOSIS — M5413 Radiculopathy, cervicothoracic region: Secondary | ICD-10-CM | POA: Diagnosis not present

## 2020-01-10 DIAGNOSIS — M542 Cervicalgia: Secondary | ICD-10-CM | POA: Diagnosis not present

## 2020-01-10 DIAGNOSIS — M9901 Segmental and somatic dysfunction of cervical region: Secondary | ICD-10-CM | POA: Diagnosis not present

## 2020-01-14 DIAGNOSIS — M542 Cervicalgia: Secondary | ICD-10-CM | POA: Diagnosis not present

## 2020-01-14 DIAGNOSIS — M9901 Segmental and somatic dysfunction of cervical region: Secondary | ICD-10-CM | POA: Diagnosis not present

## 2020-01-14 DIAGNOSIS — M9902 Segmental and somatic dysfunction of thoracic region: Secondary | ICD-10-CM | POA: Diagnosis not present

## 2020-01-14 DIAGNOSIS — M5413 Radiculopathy, cervicothoracic region: Secondary | ICD-10-CM | POA: Diagnosis not present

## 2020-01-22 DIAGNOSIS — M542 Cervicalgia: Secondary | ICD-10-CM | POA: Diagnosis not present

## 2020-01-22 DIAGNOSIS — M9902 Segmental and somatic dysfunction of thoracic region: Secondary | ICD-10-CM | POA: Diagnosis not present

## 2020-01-22 DIAGNOSIS — M9901 Segmental and somatic dysfunction of cervical region: Secondary | ICD-10-CM | POA: Diagnosis not present

## 2020-01-22 DIAGNOSIS — M5413 Radiculopathy, cervicothoracic region: Secondary | ICD-10-CM | POA: Diagnosis not present

## 2020-01-24 DIAGNOSIS — M9901 Segmental and somatic dysfunction of cervical region: Secondary | ICD-10-CM | POA: Diagnosis not present

## 2020-01-24 DIAGNOSIS — M5413 Radiculopathy, cervicothoracic region: Secondary | ICD-10-CM | POA: Diagnosis not present

## 2020-01-24 DIAGNOSIS — M542 Cervicalgia: Secondary | ICD-10-CM | POA: Diagnosis not present

## 2020-01-24 DIAGNOSIS — M9902 Segmental and somatic dysfunction of thoracic region: Secondary | ICD-10-CM | POA: Diagnosis not present

## 2020-01-28 DIAGNOSIS — M5413 Radiculopathy, cervicothoracic region: Secondary | ICD-10-CM | POA: Diagnosis not present

## 2020-01-28 DIAGNOSIS — M9901 Segmental and somatic dysfunction of cervical region: Secondary | ICD-10-CM | POA: Diagnosis not present

## 2020-01-28 DIAGNOSIS — M9902 Segmental and somatic dysfunction of thoracic region: Secondary | ICD-10-CM | POA: Diagnosis not present

## 2020-01-28 DIAGNOSIS — M542 Cervicalgia: Secondary | ICD-10-CM | POA: Diagnosis not present

## 2020-01-30 DIAGNOSIS — M542 Cervicalgia: Secondary | ICD-10-CM | POA: Diagnosis not present

## 2020-01-30 DIAGNOSIS — M9902 Segmental and somatic dysfunction of thoracic region: Secondary | ICD-10-CM | POA: Diagnosis not present

## 2020-01-30 DIAGNOSIS — M5413 Radiculopathy, cervicothoracic region: Secondary | ICD-10-CM | POA: Diagnosis not present

## 2020-01-30 DIAGNOSIS — M9901 Segmental and somatic dysfunction of cervical region: Secondary | ICD-10-CM | POA: Diagnosis not present

## 2020-01-31 DIAGNOSIS — M9902 Segmental and somatic dysfunction of thoracic region: Secondary | ICD-10-CM | POA: Diagnosis not present

## 2020-01-31 DIAGNOSIS — M542 Cervicalgia: Secondary | ICD-10-CM | POA: Diagnosis not present

## 2020-01-31 DIAGNOSIS — M5413 Radiculopathy, cervicothoracic region: Secondary | ICD-10-CM | POA: Diagnosis not present

## 2020-01-31 DIAGNOSIS — M9901 Segmental and somatic dysfunction of cervical region: Secondary | ICD-10-CM | POA: Diagnosis not present

## 2020-02-01 ENCOUNTER — Ambulatory Visit: Payer: Federal, State, Local not specified - PPO | Attending: Internal Medicine

## 2020-02-01 ENCOUNTER — Other Ambulatory Visit (HOSPITAL_BASED_OUTPATIENT_CLINIC_OR_DEPARTMENT_OTHER): Payer: Self-pay | Admitting: Internal Medicine

## 2020-02-01 DIAGNOSIS — Z9189 Other specified personal risk factors, not elsewhere classified: Secondary | ICD-10-CM | POA: Diagnosis not present

## 2020-02-01 DIAGNOSIS — Z23 Encounter for immunization: Secondary | ICD-10-CM

## 2020-02-01 DIAGNOSIS — Z01419 Encounter for gynecological examination (general) (routine) without abnormal findings: Secondary | ICD-10-CM | POA: Diagnosis not present

## 2020-02-01 NOTE — Progress Notes (Signed)
   Covid-19 Vaccination Clinic  Name:  Christina Parker    MRN: [id number] DOB: Jun 06, 1975  02/01/2020  [name] was observed post Covid-19 immunization for 15 minutes without incident. She was provided with Vaccine Information Sheet and instruction to access the V-Safe system.   [name] was instructed to call 911 with any severe reactions post vaccine: Marland Kitchen Difficulty breathing  . Swelling of face and throat  . A fast heartbeat  . A bad rash all over body  . Dizziness and weakness   Immunizations Administered    Name Date Dose VIS Date Route   Pfizer COVID-19 Vaccine 02/01/2020  3:08 PM 0.3 mL 11/14/2019 Intramuscular   Manufacturer: [place]   Lot: [id number]   [place]: [id number]

## 2020-02-04 DIAGNOSIS — M9901 Segmental and somatic dysfunction of cervical region: Secondary | ICD-10-CM | POA: Diagnosis not present

## 2020-02-04 DIAGNOSIS — M542 Cervicalgia: Secondary | ICD-10-CM | POA: Diagnosis not present

## 2020-02-04 DIAGNOSIS — M9902 Segmental and somatic dysfunction of thoracic region: Secondary | ICD-10-CM | POA: Diagnosis not present

## 2020-02-04 DIAGNOSIS — M5413 Radiculopathy, cervicothoracic region: Secondary | ICD-10-CM | POA: Diagnosis not present

## 2020-02-04 MED FILL — PFIZER-BIONTECH COVID-19 VA: 30 | 21 days supply | Qty: 0 | Fill #0

## 2020-02-06 ENCOUNTER — Other Ambulatory Visit: Payer: Self-pay | Admitting: Family Medicine

## 2020-02-06 DIAGNOSIS — K219 Gastro-esophageal reflux disease without esophagitis: Secondary | ICD-10-CM

## 2020-02-06 DIAGNOSIS — M9901 Segmental and somatic dysfunction of cervical region: Secondary | ICD-10-CM | POA: Diagnosis not present

## 2020-02-06 DIAGNOSIS — M5413 Radiculopathy, cervicothoracic region: Secondary | ICD-10-CM | POA: Diagnosis not present

## 2020-02-06 DIAGNOSIS — M9902 Segmental and somatic dysfunction of thoracic region: Secondary | ICD-10-CM | POA: Diagnosis not present

## 2020-02-06 DIAGNOSIS — M542 Cervicalgia: Secondary | ICD-10-CM | POA: Diagnosis not present

## 2020-02-07 DIAGNOSIS — M9902 Segmental and somatic dysfunction of thoracic region: Secondary | ICD-10-CM | POA: Diagnosis not present

## 2020-02-07 DIAGNOSIS — M9901 Segmental and somatic dysfunction of cervical region: Secondary | ICD-10-CM | POA: Diagnosis not present

## 2020-02-07 DIAGNOSIS — M542 Cervicalgia: Secondary | ICD-10-CM | POA: Diagnosis not present

## 2020-02-07 DIAGNOSIS — M5413 Radiculopathy, cervicothoracic region: Secondary | ICD-10-CM | POA: Diagnosis not present

## 2020-02-14 DIAGNOSIS — M5413 Radiculopathy, cervicothoracic region: Secondary | ICD-10-CM | POA: Diagnosis not present

## 2020-02-14 DIAGNOSIS — M9901 Segmental and somatic dysfunction of cervical region: Secondary | ICD-10-CM | POA: Diagnosis not present

## 2020-02-14 DIAGNOSIS — M9902 Segmental and somatic dysfunction of thoracic region: Secondary | ICD-10-CM | POA: Diagnosis not present

## 2020-02-14 DIAGNOSIS — M542 Cervicalgia: Secondary | ICD-10-CM | POA: Diagnosis not present

## 2020-02-18 DIAGNOSIS — M5413 Radiculopathy, cervicothoracic region: Secondary | ICD-10-CM | POA: Diagnosis not present

## 2020-02-18 DIAGNOSIS — M542 Cervicalgia: Secondary | ICD-10-CM | POA: Diagnosis not present

## 2020-02-18 DIAGNOSIS — M9902 Segmental and somatic dysfunction of thoracic region: Secondary | ICD-10-CM | POA: Diagnosis not present

## 2020-02-18 DIAGNOSIS — M9901 Segmental and somatic dysfunction of cervical region: Secondary | ICD-10-CM | POA: Diagnosis not present

## 2020-02-21 DIAGNOSIS — M9901 Segmental and somatic dysfunction of cervical region: Secondary | ICD-10-CM | POA: Diagnosis not present

## 2020-02-21 DIAGNOSIS — M5413 Radiculopathy, cervicothoracic region: Secondary | ICD-10-CM | POA: Diagnosis not present

## 2020-02-21 DIAGNOSIS — M542 Cervicalgia: Secondary | ICD-10-CM | POA: Diagnosis not present

## 2020-02-21 DIAGNOSIS — M9902 Segmental and somatic dysfunction of thoracic region: Secondary | ICD-10-CM | POA: Diagnosis not present

## 2020-02-22 DIAGNOSIS — N393 Stress incontinence (female) (male): Secondary | ICD-10-CM | POA: Diagnosis not present

## 2020-02-25 DIAGNOSIS — M5413 Radiculopathy, cervicothoracic region: Secondary | ICD-10-CM | POA: Diagnosis not present

## 2020-02-25 DIAGNOSIS — M9901 Segmental and somatic dysfunction of cervical region: Secondary | ICD-10-CM | POA: Diagnosis not present

## 2020-02-25 DIAGNOSIS — M542 Cervicalgia: Secondary | ICD-10-CM | POA: Diagnosis not present

## 2020-02-25 DIAGNOSIS — M9902 Segmental and somatic dysfunction of thoracic region: Secondary | ICD-10-CM | POA: Diagnosis not present

## 2020-03-06 DIAGNOSIS — Z79899 Other long term (current) drug therapy: Secondary | ICD-10-CM | POA: Diagnosis not present

## 2020-03-06 DIAGNOSIS — M0609 Rheumatoid arthritis without rheumatoid factor, multiple sites: Secondary | ICD-10-CM | POA: Diagnosis not present

## 2020-03-06 DIAGNOSIS — R5382 Chronic fatigue, unspecified: Secondary | ICD-10-CM | POA: Diagnosis not present

## 2020-03-06 DIAGNOSIS — Z111 Encounter for screening for respiratory tuberculosis: Secondary | ICD-10-CM | POA: Diagnosis not present

## 2020-03-06 DIAGNOSIS — M2550 Pain in unspecified joint: Secondary | ICD-10-CM | POA: Diagnosis not present

## 2020-03-06 DIAGNOSIS — R21 Rash and other nonspecific skin eruption: Secondary | ICD-10-CM | POA: Diagnosis not present

## 2020-03-19 ENCOUNTER — Encounter: Payer: Self-pay | Admitting: Family Medicine

## 2020-03-21 ENCOUNTER — Encounter: Payer: Self-pay | Admitting: Family Medicine

## 2020-03-24 ENCOUNTER — Encounter: Payer: Self-pay | Admitting: Family Medicine

## 2020-04-21 ENCOUNTER — Encounter: Payer: Self-pay | Admitting: Family Medicine

## 2020-04-22 NOTE — Telephone Encounter (Signed)
Forms have been sent to scan.

## 2020-04-28 ENCOUNTER — Other Ambulatory Visit: Payer: Self-pay

## 2020-04-28 ENCOUNTER — Ambulatory Visit: Payer: Federal, State, Local not specified - PPO | Admitting: Obstetrics & Gynecology

## 2020-04-28 ENCOUNTER — Encounter: Payer: Self-pay | Admitting: Obstetrics & Gynecology

## 2020-04-28 VITALS — BP 133/80 | HR 114 | Wt 239.0 lb

## 2020-04-28 DIAGNOSIS — Z7689 Persons encountering health services in other specified circumstances: Secondary | ICD-10-CM

## 2020-04-28 DIAGNOSIS — R319 Hematuria, unspecified: Secondary | ICD-10-CM

## 2020-04-28 LAB — POCT URINALYSIS DIPSTICK
Bilirubin, UA: NEGATIVE
Blood, UA: POSITIVE
Glucose, UA: NEGATIVE
Ketones, UA: NEGATIVE
Leukocytes, UA: NEGATIVE
Nitrite, UA: NEGATIVE
Protein, UA: NEGATIVE
Spec Grav, UA: 1.02 (ref 1.010–1.025)
Urobilinogen, UA: 0.2 E.U./dL
pH, UA: 6.5 (ref 5.0–8.0)

## 2020-04-28 NOTE — Progress Notes (Signed)
Subjective:     Christina Parker is a 45 y.o. female here to establish care. Pt reports that she had an endometrial ablation in 2015. She subsequently had a [facility] with ovarian cystectomy in 01/2019. Pt reports that she had '4 masses' that were removed and there was a concern prior to the surgery about ovarian cancer but, she is concerned because she has learned that the pathology was not properly performed. Pt was initially seen at [facility], then was seen by [facility] and following that [facility]. A [facility] surgeon recommended surgery but, the pt requested a MRI. Based on the results, her provider, Dr. Julien Girt referred her to the [facility] cancer center where her surgery was ultimately performed.  Pt reports that  She has a strong history of  cancer in her family: her father had prostate cancer; her daughter had a breast reduction and was noted to have breast cancer and her paternal aunt was dx'd with ovarian cancer at the age of 44 years of age.  She is concerned that since the specimens were not fully evaluated that there could be residual cancer cells that were unattended.   Pt reports that I am the 10th OB/GYN that she has seen recently. When queried about how I could help, pt responded that she is hoping that someone will request the actual specimen from [facility] and have them reviewed.    Pt was noted to have lower abd pressure in Feb and was seen and dx'd with hematuria. Pt reports that she was sent to urology and she saw a urologist at [facility] that she does not want to see any longer.     Pt is an Therapist, sports by profession.     Gynecologic History Patient's last menstrual period was 07/08/2013. Contraception: status post hysterectomy Last Pap: prior to hysterectomy Results were: normal Last mammogram: 01/04/2020. Results were: normal  Obstetric History OB History  Gravida Para Term Preterm AB Living  2 2 1 1   2   SAB IAB Ectopic Multiple Live Births          2    # Outcome Date GA Lbr Len/2nd Weight Sex  Delivery Anes PTL Lv  2 Preterm 2005 [redacted]w[redacted]d  M  EPI N LIV  1 Term 1997 [redacted]w[redacted]d F Vag-Spont EPI N LIV    The following portions of the patient's history were reviewed and updated as appropriate: allergies, current medications, past family history, past medical history, past social history, past surgical history and problem list.  Review of Systems Pertinent items are noted in HPI.    Objective:  BP 133/80   Pulse (!) 114   Wt 239 lb (108.4 kg)   LMP 07/08/2013   BMI 37.43 kg/m   CONSTITUTIONAL: Well-developed, well-nourished female in no acute distress.  HENT:  Normocephalic, atraumatic EYES: Conjunctivae and EOM are normal. No scleral icterus.  NECK: Normal range of motion SKIN: Skin is warm and dry. No rash noted. Not diaphoretic.No pallor. [place] and oriented to person, place, and time. Normal coordination.   Urine: + blood.  Assessment:  Encounter to establish care:    I have reviewed with pt that I can review her records however, it is not customary to have a GYN independent of the pts prev care  pull path specimens to have it reviewed by pathology. Pt has no immediate clinical concerns related to her hysterectomy. I empathize with her concerns.   I have reviewed the records which she brought with  her which include the MRI dated 04/24/2019, the CA 125 from 05/03/2019 and the surgical pathology results including cytology from 06/14/2019. I do not see any immediate concerns.  I have informed the pt that I will be happy to review any additional records that she has send and she has signed release of records.       Hematuria: The etiology of this is unclear. Pt has already been seen by urology but, would like to transfer to another urologist.  She is asymptomatic but, I believe that as a first step, her urein should be sent for cx.     Plan:   Bilan was seen today for establish care.  Diagnoses and all orders for this visit:  Hematuria, unspecified type -     POCT  urinalysis dipstick -     Urine Culture -     Ambulatory referral to Urology  Establishing care with new doctor, encounter for  Total face-to-face time with patient, review of chart and completion of same, discussion and coordination of care was 48mn.    Rodnesha Elie L. Harraway-Smith, M.D., FCherlynn June

## 2020-04-30 ENCOUNTER — Encounter: Payer: Self-pay | Admitting: General Practice

## 2020-04-30 LAB — URINE CULTURE: Organism ID, Bacteria: NO GROWTH

## 2020-05-01 ENCOUNTER — Encounter: Payer: Self-pay | Admitting: General Practice

## 2020-05-01 DIAGNOSIS — Z13228 Encounter for screening for other metabolic disorders: Secondary | ICD-10-CM | POA: Diagnosis not present

## 2020-05-01 DIAGNOSIS — R0602 Shortness of breath: Secondary | ICD-10-CM | POA: Diagnosis not present

## 2020-05-01 DIAGNOSIS — Z0000 Encounter for general adult medical examination without abnormal findings: Secondary | ICD-10-CM | POA: Diagnosis not present

## 2020-05-01 DIAGNOSIS — R5383 Other fatigue: Secondary | ICD-10-CM | POA: Diagnosis not present

## 2020-05-01 DIAGNOSIS — Z13220 Encounter for screening for lipoid disorders: Secondary | ICD-10-CM | POA: Diagnosis not present

## 2020-05-01 DIAGNOSIS — Z136 Encounter for screening for cardiovascular disorders: Secondary | ICD-10-CM | POA: Diagnosis not present

## 2020-05-01 DIAGNOSIS — Z1331 Encounter for screening for depression: Secondary | ICD-10-CM | POA: Diagnosis not present

## 2020-05-01 DIAGNOSIS — Z1339 Encounter for screening examination for other mental health and behavioral disorders: Secondary | ICD-10-CM | POA: Diagnosis not present

## 2020-05-01 DIAGNOSIS — Z1159 Encounter for screening for other viral diseases: Secondary | ICD-10-CM | POA: Diagnosis not present

## 2020-05-01 DIAGNOSIS — E559 Vitamin D deficiency, unspecified: Secondary | ICD-10-CM | POA: Diagnosis not present

## 2020-05-01 DIAGNOSIS — R3129 Other microscopic hematuria: Secondary | ICD-10-CM | POA: Diagnosis not present

## 2020-05-01 DIAGNOSIS — M059 Rheumatoid arthritis with rheumatoid factor, unspecified: Secondary | ICD-10-CM | POA: Diagnosis not present

## 2020-05-01 DIAGNOSIS — Z20822 Contact with and (suspected) exposure to covid-19: Secondary | ICD-10-CM | POA: Diagnosis not present

## 2020-05-01 DIAGNOSIS — Z114 Encounter for screening for human immunodeficiency virus [HIV]: Secondary | ICD-10-CM | POA: Diagnosis not present

## 2020-05-01 DIAGNOSIS — Z131 Encounter for screening for diabetes mellitus: Secondary | ICD-10-CM | POA: Diagnosis not present

## 2020-05-01 DIAGNOSIS — Z6838 Body mass index (BMI) 38.0-38.9, adult: Secondary | ICD-10-CM | POA: Diagnosis not present

## 2020-05-01 DIAGNOSIS — Z79899 Other long term (current) drug therapy: Secondary | ICD-10-CM | POA: Diagnosis not present

## 2020-05-02 ENCOUNTER — Encounter: Payer: Self-pay | Admitting: General Practice

## 2020-05-02 DIAGNOSIS — R3129 Other microscopic hematuria: Secondary | ICD-10-CM | POA: Diagnosis not present

## 2020-05-15 DIAGNOSIS — E559 Vitamin D deficiency, unspecified: Secondary | ICD-10-CM | POA: Diagnosis not present

## 2020-05-15 DIAGNOSIS — E1165 Type 2 diabetes mellitus with hyperglycemia: Secondary | ICD-10-CM | POA: Diagnosis not present

## 2020-05-15 DIAGNOSIS — Z6838 Body mass index (BMI) 38.0-38.9, adult: Secondary | ICD-10-CM | POA: Diagnosis not present

## 2020-05-15 DIAGNOSIS — R1900 Intra-abdominal and pelvic swelling, mass and lump, unspecified site: Secondary | ICD-10-CM | POA: Diagnosis not present

## 2020-05-15 DIAGNOSIS — M059 Rheumatoid arthritis with rheumatoid factor, unspecified: Secondary | ICD-10-CM | POA: Diagnosis not present

## 2020-05-15 DIAGNOSIS — E876 Hypokalemia: Secondary | ICD-10-CM | POA: Diagnosis not present

## 2020-05-21 DIAGNOSIS — Z90710 Acquired absence of both cervix and uterus: Secondary | ICD-10-CM | POA: Diagnosis not present

## 2020-05-21 DIAGNOSIS — R1909 Other intra-abdominal and pelvic swelling, mass and lump: Secondary | ICD-10-CM | POA: Diagnosis not present

## 2020-05-21 DIAGNOSIS — R1900 Intra-abdominal and pelvic swelling, mass and lump, unspecified site: Secondary | ICD-10-CM | POA: Diagnosis not present

## 2020-05-21 DIAGNOSIS — N838 Other noninflammatory disorders of ovary, fallopian tube and broad ligament: Secondary | ICD-10-CM | POA: Diagnosis not present

## 2020-05-23 DIAGNOSIS — R1900 Intra-abdominal and pelvic swelling, mass and lump, unspecified site: Secondary | ICD-10-CM | POA: Diagnosis not present

## 2020-05-26 ENCOUNTER — Other Ambulatory Visit: Payer: Self-pay | Admitting: Obstetrics & Gynecology

## 2020-05-26 DIAGNOSIS — R030 Elevated blood-pressure reading, without diagnosis of hypertension: Secondary | ICD-10-CM | POA: Diagnosis not present

## 2020-05-26 DIAGNOSIS — E1165 Type 2 diabetes mellitus with hyperglycemia: Secondary | ICD-10-CM | POA: Diagnosis not present

## 2020-05-26 DIAGNOSIS — R1900 Intra-abdominal and pelvic swelling, mass and lump, unspecified site: Secondary | ICD-10-CM | POA: Diagnosis not present

## 2020-05-26 DIAGNOSIS — R102 Pelvic and perineal pain: Secondary | ICD-10-CM | POA: Diagnosis not present

## 2020-05-26 DIAGNOSIS — M059 Rheumatoid arthritis with rheumatoid factor, unspecified: Secondary | ICD-10-CM | POA: Diagnosis not present

## 2020-05-26 DIAGNOSIS — Z6838 Body mass index (BMI) 38.0-38.9, adult: Secondary | ICD-10-CM | POA: Diagnosis not present

## 2020-05-28 ENCOUNTER — Encounter: Payer: Self-pay | Admitting: Family Medicine

## 2020-05-29 DIAGNOSIS — R102 Pelvic and perineal pain: Secondary | ICD-10-CM | POA: Diagnosis not present

## 2020-05-29 DIAGNOSIS — R3121 Asymptomatic microscopic hematuria: Secondary | ICD-10-CM | POA: Diagnosis not present

## 2020-05-29 DIAGNOSIS — R3915 Urgency of urination: Secondary | ICD-10-CM | POA: Diagnosis not present

## 2020-05-30 ENCOUNTER — Ambulatory Visit
Admission: RE | Admit: 2020-05-30 | Discharge: 2020-05-30 | Disposition: A | Discharge status: Home / Self Care | Payer: Federal, State, Local not specified - PPO | Source: Ambulatory Visit | Attending: Obstetrics & Gynecology | Admitting: Obstetrics & Gynecology

## 2020-05-30 ENCOUNTER — Other Ambulatory Visit: Payer: Self-pay | Admitting: Obstetrics & Gynecology

## 2020-05-30 ENCOUNTER — Other Ambulatory Visit: Payer: Self-pay

## 2020-05-30 ENCOUNTER — Ambulatory Visit
Admission: RE | Admit: 2020-05-30 | Discharge: 2020-05-30 | Disposition: A | Discharge status: Home / Self Care | Payer: Self-pay | Source: Ambulatory Visit | Attending: Obstetrics & Gynecology | Admitting: Obstetrics & Gynecology

## 2020-05-30 DIAGNOSIS — R1900 Intra-abdominal and pelvic swelling, mass and lump, unspecified site: Secondary | ICD-10-CM | POA: Diagnosis not present

## 2020-05-30 DIAGNOSIS — D259 Leiomyoma of uterus, unspecified: Secondary | ICD-10-CM | POA: Diagnosis not present

## 2020-05-30 DIAGNOSIS — N83291 Other ovarian cyst, right side: Secondary | ICD-10-CM | POA: Diagnosis not present

## 2020-05-30 MED ORDER — GADOBENATE DIMEGLUMINE 529 MG/ML IV SOLN
20.0000 mL | Freq: Once | INTRAVENOUS | Status: AC | PRN
  Administered 2020-05-30: 20 mL via INTRAVENOUS

## 2020-06-04 DIAGNOSIS — M059 Rheumatoid arthritis with rheumatoid factor, unspecified: Secondary | ICD-10-CM | POA: Diagnosis not present

## 2020-06-04 DIAGNOSIS — R1900 Intra-abdominal and pelvic swelling, mass and lump, unspecified site: Secondary | ICD-10-CM | POA: Diagnosis not present

## 2020-06-04 DIAGNOSIS — R030 Elevated blood-pressure reading, without diagnosis of hypertension: Secondary | ICD-10-CM | POA: Diagnosis not present

## 2020-06-04 DIAGNOSIS — R748 Abnormal levels of other serum enzymes: Secondary | ICD-10-CM | POA: Diagnosis not present

## 2020-06-04 DIAGNOSIS — E1165 Type 2 diabetes mellitus with hyperglycemia: Secondary | ICD-10-CM | POA: Diagnosis not present

## 2020-06-04 DIAGNOSIS — F4322 Adjustment disorder with anxiety: Secondary | ICD-10-CM | POA: Diagnosis not present

## 2020-06-04 DIAGNOSIS — R102 Pelvic and perineal pain: Secondary | ICD-10-CM | POA: Diagnosis not present

## 2020-06-04 DIAGNOSIS — Z6838 Body mass index (BMI) 38.0-38.9, adult: Secondary | ICD-10-CM | POA: Diagnosis not present

## 2020-06-07 ENCOUNTER — Other Ambulatory Visit: Payer: Federal, State, Local not specified - PPO

## 2020-06-09 ENCOUNTER — Encounter: Payer: Self-pay | Admitting: Family Medicine

## 2020-06-12 ENCOUNTER — Ambulatory Visit: Payer: Federal, State, Local not specified - PPO | Admitting: Obstetrics & Gynecology

## 2020-06-12 ENCOUNTER — Telehealth: Payer: Self-pay | Admitting: Obstetrics & Gynecology

## 2020-06-12 NOTE — Telephone Encounter (Signed)
TC to pt. She is upset about her records being scanned into the chart as physician communication. She believes that the entire document was not scanned and she wants them removed. I have tried to explain to her that we only scanned in what records came to [place]. Pt reports that her prev surgery was done and now she has a mass in her pelvis and there is a cover up of the doctors and she feels that we are trying to protect those doctors. She expressed her dismay for several minutes then apologized from screaming and ended the call after asking if I had further questions.   Vaughn Beaumier L. Harraway-Smith, M.D., Cherlynn June

## 2020-06-19 DIAGNOSIS — N83209 Unspecified ovarian cyst, unspecified side: Secondary | ICD-10-CM | POA: Diagnosis not present

## 2020-06-19 DIAGNOSIS — R102 Pelvic and perineal pain: Secondary | ICD-10-CM | POA: Diagnosis not present

## 2020-06-19 DIAGNOSIS — Z6837 Body mass index (BMI) 37.0-37.9, adult: Secondary | ICD-10-CM | POA: Diagnosis not present

## 2020-06-20 DIAGNOSIS — N83201 Unspecified ovarian cyst, right side: Secondary | ICD-10-CM | POA: Diagnosis not present

## 2020-06-23 DIAGNOSIS — F0631 Mood disorder due to known physiological condition with depressive features: Secondary | ICD-10-CM | POA: Diagnosis not present

## 2020-06-23 DIAGNOSIS — F418 Other specified anxiety disorders: Secondary | ICD-10-CM | POA: Diagnosis not present

## 2020-06-23 DIAGNOSIS — Z79899 Other long term (current) drug therapy: Secondary | ICD-10-CM | POA: Diagnosis not present

## 2020-07-10 DIAGNOSIS — R310 Gross hematuria: Secondary | ICD-10-CM | POA: Diagnosis not present

## 2020-07-16 DIAGNOSIS — G8929 Other chronic pain: Secondary | ICD-10-CM | POA: Diagnosis not present

## 2020-07-16 DIAGNOSIS — Z79899 Other long term (current) drug therapy: Secondary | ICD-10-CM | POA: Diagnosis not present

## 2020-07-16 DIAGNOSIS — K219 Gastro-esophageal reflux disease without esophagitis: Secondary | ICD-10-CM | POA: Diagnosis not present

## 2020-07-16 DIAGNOSIS — Z20822 Contact with and (suspected) exposure to covid-19: Secondary | ICD-10-CM | POA: Diagnosis not present

## 2020-07-16 DIAGNOSIS — N939 Abnormal uterine and vaginal bleeding, unspecified: Secondary | ICD-10-CM | POA: Diagnosis not present

## 2020-07-16 DIAGNOSIS — R102 Pelvic and perineal pain: Secondary | ICD-10-CM | POA: Diagnosis not present

## 2020-07-16 DIAGNOSIS — Z8742 Personal history of other diseases of the female genital tract: Secondary | ICD-10-CM | POA: Diagnosis not present

## 2020-07-16 DIAGNOSIS — N831 Corpus luteum cyst of ovary, unspecified side: Secondary | ICD-10-CM | POA: Diagnosis not present

## 2020-07-16 DIAGNOSIS — J45909 Unspecified asthma, uncomplicated: Secondary | ICD-10-CM | POA: Diagnosis not present

## 2020-07-16 DIAGNOSIS — Z90710 Acquired absence of both cervix and uterus: Secondary | ICD-10-CM | POA: Diagnosis not present

## 2020-07-16 DIAGNOSIS — N8312 Corpus luteum cyst of left ovary: Secondary | ICD-10-CM | POA: Diagnosis not present

## 2020-07-16 DIAGNOSIS — N800 Endometriosis of uterus: Secondary | ICD-10-CM | POA: Diagnosis not present

## 2020-07-16 DIAGNOSIS — Z6841 Body Mass Index (BMI) 40.0 and over, adult: Secondary | ICD-10-CM | POA: Diagnosis not present

## 2020-07-16 DIAGNOSIS — N8311 Corpus luteum cyst of right ovary: Secondary | ICD-10-CM | POA: Diagnosis not present

## 2020-07-16 DIAGNOSIS — M069 Rheumatoid arthritis, unspecified: Secondary | ICD-10-CM | POA: Diagnosis not present

## 2020-07-16 DIAGNOSIS — C569 Malignant neoplasm of unspecified ovary: Secondary | ICD-10-CM | POA: Diagnosis not present

## 2020-07-24 DIAGNOSIS — R160 Hepatomegaly, not elsewhere classified: Secondary | ICD-10-CM | POA: Diagnosis not present

## 2020-07-24 DIAGNOSIS — K760 Fatty (change of) liver, not elsewhere classified: Secondary | ICD-10-CM | POA: Diagnosis not present

## 2020-07-24 DIAGNOSIS — Z90721 Acquired absence of ovaries, unilateral: Secondary | ICD-10-CM | POA: Diagnosis not present

## 2020-07-24 DIAGNOSIS — R310 Gross hematuria: Secondary | ICD-10-CM | POA: Diagnosis not present

## 2020-07-24 DIAGNOSIS — R188 Other ascites: Secondary | ICD-10-CM | POA: Diagnosis not present

## 2020-08-18 DIAGNOSIS — F0631 Mood disorder due to known physiological condition with depressive features: Secondary | ICD-10-CM | POA: Diagnosis not present

## 2020-08-18 DIAGNOSIS — F418 Other specified anxiety disorders: Secondary | ICD-10-CM | POA: Diagnosis not present

## 2020-09-22 DIAGNOSIS — R310 Gross hematuria: Secondary | ICD-10-CM | POA: Diagnosis not present

## 2020-09-22 DIAGNOSIS — R319 Hematuria, unspecified: Secondary | ICD-10-CM | POA: Diagnosis not present

## 2021-05-20 ENCOUNTER — Other Ambulatory Visit: Payer: Self-pay | Admitting: Obstetrics and Gynecology

## 2021-05-20 DIAGNOSIS — Z1231 Encounter for screening mammogram for malignant neoplasm of breast: Secondary | ICD-10-CM

## 2021-05-21 ENCOUNTER — Ambulatory Visit
Admission: RE | Admit: 2021-05-21 | Discharge: 2021-05-21 | Disposition: A | Discharge status: Home / Self Care | Payer: 59 | Source: Ambulatory Visit | Attending: Obstetrics and Gynecology | Admitting: Obstetrics and Gynecology

## 2021-05-21 DIAGNOSIS — Z1231 Encounter for screening mammogram for malignant neoplasm of breast: Secondary | ICD-10-CM

## 2021-05-22 ENCOUNTER — Ambulatory Visit: Payer: Federal, State, Local not specified - PPO

## 2021-05-28 ENCOUNTER — Encounter: Payer: Self-pay | Admitting: Gastroenterology

## 2021-06-02 ENCOUNTER — Other Ambulatory Visit: Payer: Self-pay | Admitting: Obstetrics and Gynecology

## 2021-06-02 DIAGNOSIS — R102 Pelvic and perineal pain: Secondary | ICD-10-CM

## 2021-06-03 ENCOUNTER — Other Ambulatory Visit (HOSPITAL_BASED_OUTPATIENT_CLINIC_OR_DEPARTMENT_OTHER): Payer: Self-pay | Admitting: Obstetrics and Gynecology

## 2021-06-03 DIAGNOSIS — R102 Pelvic and perineal pain: Secondary | ICD-10-CM

## 2021-06-06 ENCOUNTER — Ambulatory Visit (HOSPITAL_BASED_OUTPATIENT_CLINIC_OR_DEPARTMENT_OTHER)
Admission: RE | Admit: 2021-06-06 | Discharge: 2021-06-06 | Disposition: A | Discharge status: Home / Self Care | Payer: 59 | Source: Ambulatory Visit | Attending: Obstetrics and Gynecology | Admitting: Obstetrics and Gynecology

## 2021-06-06 DIAGNOSIS — K760 Fatty (change of) liver, not elsewhere classified: Secondary | ICD-10-CM | POA: Diagnosis not present

## 2021-06-06 DIAGNOSIS — R102 Pelvic and perineal pain: Secondary | ICD-10-CM | POA: Insufficient documentation

## 2021-06-06 MED ORDER — GADOBUTROL 1 MMOL/ML IV SOLN
10.0000 mL | Freq: Once | INTRAVENOUS | Status: AC | PRN
  Administered 2021-06-06: 10 mL via INTRAVENOUS

## 2021-06-25 ENCOUNTER — Ambulatory Visit (INDEPENDENT_AMBULATORY_CARE_PROVIDER_SITE_OTHER): Payer: 59 | Admitting: Gastroenterology

## 2021-06-25 ENCOUNTER — Encounter: Payer: Self-pay | Admitting: Gastroenterology

## 2021-06-25 ENCOUNTER — Other Ambulatory Visit (INDEPENDENT_AMBULATORY_CARE_PROVIDER_SITE_OTHER): Payer: 59

## 2021-06-25 VITALS — BP 148/86 | HR 101 | Ht 67.0 in | Wt 231.1 lb

## 2021-06-25 DIAGNOSIS — R7989 Other specified abnormal findings of blood chemistry: Secondary | ICD-10-CM

## 2021-06-25 DIAGNOSIS — K760 Fatty (change of) liver, not elsewhere classified: Secondary | ICD-10-CM

## 2021-06-25 DIAGNOSIS — Z1211 Encounter for screening for malignant neoplasm of colon: Secondary | ICD-10-CM

## 2021-06-25 DIAGNOSIS — Z1212 Encounter for screening for malignant neoplasm of rectum: Secondary | ICD-10-CM

## 2021-06-25 LAB — CBC WITH DIFFERENTIAL/PLATELET
Basophils Absolute: 0.1 10*3/uL (ref 0.0–0.1)
Basophils Relative: 0.7 % (ref 0.0–3.0)
Eosinophils Absolute: 0.1 10*3/uL (ref 0.0–0.7)
Eosinophils Relative: 1.2 % (ref 0.0–5.0)
HCT: 41 % (ref 36.0–46.0)
Hemoglobin: 13.4 g/dL (ref 12.0–15.0)
Lymphocytes Relative: 35.8 % (ref 12.0–46.0)
Lymphs Abs: 2.5 10*3/uL (ref 0.7–4.0)
MCHC: 32.8 g/dL (ref 30.0–36.0)
MCV: 83.4 fl (ref 78.0–100.0)
Monocytes Absolute: 0.4 10*3/uL (ref 0.1–1.0)
Monocytes Relative: 5.6 % (ref 3.0–12.0)
Neutro Abs: 4 10*3/uL (ref 1.4–7.7)
Neutrophils Relative %: 56.7 % (ref 43.0–77.0)
Platelets: 310 10*3/uL (ref 150.0–400.0)
RBC: 4.92 Mil/uL (ref 3.87–5.11)
RDW: 14.4 % (ref 11.5–15.5)
WBC: 7 10*3/uL (ref 4.0–10.5)

## 2021-06-25 LAB — COMPREHENSIVE METABOLIC PANEL
ALT: 39 U/L — ABNORMAL HIGH (ref 0–35)
AST: 49 U/L — ABNORMAL HIGH (ref 0–37)
Albumin: 4.2 g/dL (ref 3.5–5.2)
Alkaline Phosphatase: 86 U/L (ref 39–117)
BUN: 11 mg/dL (ref 6–23)
CO2: 25 mEq/L (ref 19–32)
Calcium: 9.9 mg/dL (ref 8.4–10.5)
Chloride: 102 mEq/L (ref 96–112)
Creatinine, Ser: 0.8 mg/dL (ref 0.40–1.20)
GFR: 89.3 mL/min (ref >60.00)
Glucose, Bld: 144 mg/dL — ABNORMAL HIGH (ref 70–99)
Potassium: 3.6 mEq/L (ref 3.5–5.1)
Sodium: 140 mEq/L (ref 135–145)
Total Bilirubin: 0.4 mg/dL (ref 0.2–1.2)
Total Protein: 8.5 g/dL — ABNORMAL HIGH (ref 6.0–8.3)

## 2021-06-25 LAB — IBC + FERRITIN
Ferritin: 178.5 ng/mL (ref 10.0–291.0)
Iron: 55 ug/dL (ref 42–145)
Saturation Ratios: 15.5 % — ABNORMAL LOW (ref 20.0–50.0)
TIBC: 355.6 ug/dL (ref 250.0–450.0)
Transferrin: 254 mg/dL (ref 212.0–360.0)

## 2021-06-25 LAB — GAMMA GT: GGT: 42 U/L (ref 7–51)

## 2021-06-25 NOTE — Progress Notes (Addendum)
Chief Complaint: Abnormal LFTs  Referring Provider:  Glendon Axe, MD     Addendum-patient does not want note to go to Dr. Glendon Axe.  Her name has been taken off when we got her first message.   ASSESSMENT AND PLAN;   #1. Abn LFTs d/t fatty liver. R/O other causes  #2. Colorectal cancer screening (FH polyps-mom at age 46)  Plan: -Check CBC, CMP, acute viral hepatitis, autoimmune hepatitis panel (AMA, ASMA) iron studies, serum ceruloplasmin, A1AT, celiac screen and GGT. -Check anti-HAV total Ab and HBsAb.  If neg, would recommend vaccination. -USE -Colon at 55 (July or Aug 2023) -Must exercise (dances for church). Stop all fried foods. -MRI Abdo reviewed with the pt.    Discussed risks & benefits of colonoscopy. Risks including rare perforation req laparotomy, bleeding after bx/polypectomy req blood transfusion, rarely missing neoplasms, risks of anesthesia/sedation, rare risk of damage to internal organs. Benefits outweigh the risks. Patient agrees to proceed. All the questions were answered. Pt consents to proceed.  HPI:    Christina Parker is a 46 y.o. 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  With AST 48, ALT 36, normal TB and albumin.  On 05/13/2021 AST 47, ALT 44.  Her liver function tests in 2021 were normal as below.  She underwent CT followed by MRI as detailed below showing fatty liver without any biliary dilatation.  Note that she is s/p cholecystectomy.  She has been trying to lose weight and has been able to lose approximately 10 pounds over the last 3 months.  She has been dancing at church.  No nausea, vomiting, heartburn (on protonix), regurgitation, odynophagia or dysphagia.  No significant diarrhea or constipation.  No melena or hematochezia. No unintentional weight loss. No abdominal pain.  She did  have pelvic pain which is better ever since she had hysterectomy/BSO  No H/O itching, skin lesions, easy bruisability, intake of OTC meds including diet pills, herbal medications, anabolic steroids or Tylenol. There is no H/O blood transfusions, IVDA or FH of liver disease. No jaundice, dark urine or pale stools. No alcohol abuse.  Used to be constipation Now BM 1/day No melena or hematochezia. Mom - had polyps -at age 10 No CRC  Off humira (was on d/t psoriatic arhtritis). Jan 2022  Past GI work-up:  MRI abdomen with and without contrast 05/2021 1. No acute findings in the abdomen or pelvis. 2. Severe diffuse hepatic steatosis, which in the setting of steatohepatitis can be a cause of pain. 3. Status post hysterectomy. The ovaries are not confidently identified may be surgically absent.  CT AP with contrast 04/2021.  Compared with CT 02/2019 -No acute abnormalities -Diffuse and marked fatty infiltration of the liver -S/p cholecystectomy.  No biliary ductal dilatation.  Past Medical History:  Diagnosis Date   Anemia    Asthma    exercise induction and seonal allerggies casuse flars   Constipation    Family history of anesthesia complication    pt's daughter has PO N&V   GERD (gastroesophageal reflux disease)    Heart murmur    MVP   History of vitamin B deficiency    History of vitamin D deficiency    MVP (mitral valve prolapse)    Pneumonia    Shortness of breath dyspnea    with abdominal bloating since Jan 2017- "gallbladder"  Vaginal delivery 1997, 2005    Past Surgical History:  Procedure Laterality Date   ABDOMINAL HYSTERECTOMY     CHOLECYSTECTOMY N/A 04/24/2015   Procedure: LAPAROSCOPIC CHOLECYSTECTOMY WITH INTRAOPERATIVE CHOLANGIOGRAM;  Surgeon: Erroll Luna, MD;  Location: [facility];  Service: General;  Laterality: N/A;   [place] N/A 08/02/2013   Procedure: DILATATION & CURETTAGE/HYSTEROSCOPY WITH NOVASURE  ABLATION;  Surgeon: Marylynn Pearson, MD;  Location: [facility];  Service: Gynecology;  Laterality: N/A;   TUBAL LIGATION  2005    Family History  Problem Relation Age of Onset   Allergies Mother    Diabetes Sister    Diabetes Maternal Grandmother    Hypertension Maternal Grandmother    Hypertension Maternal Grandfather    Diabetes Paternal Grandmother    Hypertension Paternal Grandmother    Diabetes Paternal Grandfather    Hypertension Paternal Grandfather    Breast cancer Daughter 13   Asthma Son    Allergies Son    Stomach cancer Neg Hx    Colon cancer Neg Hx    Rectal cancer Neg Hx     Social History   Tobacco Use   Smoking status: Never   Smokeless tobacco: Never   Tobacco comments:    Exposed x 11 years. Pt works in home health  Vaping Use   Vaping Use: Never used  Substance Use Topics   Alcohol use: No    Alcohol/week: 0.0 standard drinks   Drug use: No    Current Outpatient Medications  Medication Sig Dispense Refill   albuterol (PROVENTIL HFA;VENTOLIN HFA) 108 (90 BASE) MCG/ACT inhaler Inhale 2 puffs into the lungs every 4 (four) hours as needed for wheezing or shortness of breath (cough, shortness of breath or wheezing.). 1 Inhaler 1   Ascorbic Acid (VITAMIN C) 1000 MG tablet Take 1,000 mg by mouth daily.     cetirizine (ZYRTEC) 10 MG tablet Take 10 mg by mouth daily.     fluticasone (FLONASE) 50 MCG/ACT nasal spray Place 2 sprays into both nostrils daily. 16 g 2   pantoprazole (PROTONIX) 40 MG tablet TAKE 1 TABLET (40 MG TOTAL) BY MOUTH DAILY. USE AS NEEDED FOR GERD 90 tablet 3   pregabalin (LYRICA) 75 MG capsule Take 75 mg by mouth 2 (two) times daily as needed.     No current facility-administered medications for this visit.    No Known Allergies  Review of Systems:  Constitutional: Denies fever, chills, diaphoresis, appetite change and fatigue.  HEENT: Denies photophobia, eye pain, redness, hearing loss, ear pain, congestion, sore throat, rhinorrhea,  sneezing, mouth sores, neck pain, neck stiffness and tinnitus.   Respiratory: Denies SOB, DOE, cough, chest tightness,  and wheezing.   Cardiovascular: Denies chest pain, palpitations and leg swelling.  Genitourinary: Denies dysuria, urgency, frequency, hematuria, flank pain and difficulty urinating.  Musculoskeletal: Denies myalgias, back pain, joint swelling, arthralgias and gait problem.  Skin: No rash.  Neurological: Denies dizziness, seizures, syncope, weakness, light-headedness, numbness and headaches.  Hematological: Denies adenopathy. Easy bruising, personal or family bleeding history  Psychiatric/Behavioral: No anxiety or depression     Physical Exam:    BP (!) 148/86   Pulse (!) 101   Ht 5' 7"  (1.702 m)   Wt 231 lb 2 oz (104.8 kg)   LMP 07/08/2013   SpO2 98%   BMI 36.20 kg/m  Wt Readings from Last 3 Encounters:  06/25/21 231 lb 2 oz (104.8 kg)  04/28/20 239 lb (108.4 kg)  12/13/19 244 lb (  110.7 kg)   Constitutional:  Well-developed, in no acute distress. Psychiatric: Normal mood and affect. Behavior is normal. HEENT: Pupils normal.  Conjunctivae are normal. No scleral icterus. Neck supple.  Cardiovascular: Normal rate, regular rhythm. No edema Pulmonary/chest: Effort normal and breath sounds normal. No wheezing, rales or rhonchi. Abdominal: Soft, nondistended. Nontender. Bowel sounds active throughout. There are no masses palpable. No hepatomegaly. Rectal: Deferred Neurological: Alert and oriented to person place and time. Skin: Skin is warm and dry. No rashes noted.  Data Reviewed: I have personally reviewed following labs and imaging studies  CBC:    Latest Ref Rng & Units 11/07/2019    3:00 PM 03/21/2019    2:42 PM 01/04/2019    3:40 PM  CBC  WBC 3.8 - 10.8 Thousand/uL 9.6   9.9   10.4    Hemoglobin 11.7 - 15.5 g/dL 13.5   13.3   12.7    Hematocrit 35.0 - 45.0 % 40.7   40.1   39.1    Platelets 140 - 400 Thousand/uL 355   390.0   345.0      CMP:     Latest Ref Rng & Units 11/07/2019    3:00 PM 03/21/2019    2:42 PM 01/04/2019    3:40 PM  CMP  Glucose 65 - 99 mg/dL 88   83   91    BUN 7 - 25 mg/dL 12   14   16     Creatinine 0.50 - 1.10 mg/dL 0.70   0.79   0.77    Sodium 135 - 146 mmol/L 142   140   139    Potassium 3.5 - 5.3 mmol/L 3.5   3.6   3.5    Chloride 98 - 110 mmol/L 104   102   104    CO2 20 - 32 mmol/L 27   26   24     Calcium 8.6 - 10.2 mg/dL 9.3   9.6   9.2    Total Protein 6.1 - 8.1 g/dL 7.7   7.7   7.3    Total Bilirubin 0.2 - 1.2 mg/dL 0.3   0.4   0.4    Alkaline Phos 39 - 117 U/L  67   68    AST 10 - 30 U/L 27   17   14     ALT 6 - 29 U/L 21   17   15       Radiology Studies: MR PELVIS W WO CONTRAST  Result Date: 06/07/2021 CLINICAL DATA:  Chronic pelvic pain with abdominal pain. EXAM: MRI ABDOMEN AND PELVIS WITHOUT AND WITH CONTRAST TECHNIQUE: Multiplanar multisequence MR imaging of the abdomen and pelvis was performed both before and after the administration of intravenous contrast. CONTRAST:  62m GADAVIST GADOBUTROL 1 MMOL/ML IV SOLN COMPARISON:  None Available. FINDINGS: COMBINED FINDINGS FOR BOTH MR ABDOMEN AND PELVIS Lower chest: No acute abnormality. Hepatobiliary: Severe diffuse hepatic steatosis. Gallbladder is not identified and likely surgically absent. No biliary ductal dilation. Pancreas: Intrinsic T1 signal of the pancreatic parenchyma is within normal limits. No pancreatic ductal dilation. No cystic or solid hyperenhancing pancreatic lesion identified. Spleen:  No splenomegaly or focal splenic lesion. Adrenals/Urinary Tract: Bilateral adrenal glands are within normal limits. No hydronephrosis. Well-circumscribed fluid signal intensity nonenhancing 16 mm left renal lesion is consistent with a benign Bosniak classification 1 renal cyst requiring no independent follow-up. Stomach/Bowel: Stomach is unremarkable for degree of distension. No pathologic dilation of small or large bowel. No evidence of  acute bowel  inflammation. Vascular/Lymphatic: No pathologically enlarged lymph nodes identified. No abdominal aortic aneurysm demonstrated. Reproductive: Uterus is surgically absent. The ovaries are not confidently identified may be surgically absent. Other:  No significant abdominopelvic free fluid. Musculoskeletal: No suspicious bone lesions identified. IMPRESSION: 1. No acute findings in the abdomen or pelvis. 2. Severe diffuse hepatic steatosis, which in the setting of steatohepatitis can be a cause of pain. 3. Status post hysterectomy. The ovaries are not confidently identified may be surgically absent. Electronically Signed   By: Dahlia Bailiff M.D.   On: 06/07/2021 08:49   MR ABDOMEN W WO CONTRAST  Result Date: 06/07/2021 CLINICAL DATA:  Chronic pelvic pain with abdominal pain. EXAM: MRI ABDOMEN AND PELVIS WITHOUT AND WITH CONTRAST TECHNIQUE: Multiplanar multisequence MR imaging of the abdomen and pelvis was performed both before and after the administration of intravenous contrast. CONTRAST:  24m GADAVIST GADOBUTROL 1 MMOL/ML IV SOLN COMPARISON:  None Available. FINDINGS: COMBINED FINDINGS FOR BOTH MR ABDOMEN AND PELVIS Lower chest: No acute abnormality. Hepatobiliary: Severe diffuse hepatic steatosis. Gallbladder is not identified and likely surgically absent. No biliary ductal dilation. Pancreas: Intrinsic T1 signal of the pancreatic parenchyma is within normal limits. No pancreatic ductal dilation. No cystic or solid hyperenhancing pancreatic lesion identified. Spleen:  No splenomegaly or focal splenic lesion. Adrenals/Urinary Tract: Bilateral adrenal glands are within normal limits. No hydronephrosis. Well-circumscribed fluid signal intensity nonenhancing 16 mm left renal lesion is consistent with a benign Bosniak classification 1 renal cyst requiring no independent follow-up. Stomach/Bowel: Stomach is unremarkable for degree of distension. No pathologic dilation of small or large bowel. No evidence of acute  bowel inflammation. Vascular/Lymphatic: No pathologically enlarged lymph nodes identified. No abdominal aortic aneurysm demonstrated. Reproductive: Uterus is surgically absent. The ovaries are not confidently identified may be surgically absent. Other:  No significant abdominopelvic free fluid. Musculoskeletal: No suspicious bone lesions identified. IMPRESSION: 1. No acute findings in the abdomen or pelvis. 2. Severe diffuse hepatic steatosis, which in the setting of steatohepatitis can be a cause of pain. 3. Status post hysterectomy. The ovaries are not confidently identified may be surgically absent. Electronically Signed   By: JDahlia BailiffM.D.   On: 06/07/2021 08:49      RCarmell Austria MD 06/25/2021, 10:09 AM  Cc: SGlendon Axe MD  Addendum-patient does not want note to go to Dr. STamala Julian  Dr. SThompson Caulname has been taken off as referring.

## 2021-06-25 NOTE — Patient Instructions (Addendum)
If you are age 45 or older, your body mass index should be between 23-30. Your Body mass index is 36.2 kg/m. If this is out of the aforementioned range listed, please consider follow up with your Primary Care Provider.  If you are age 58 or younger, your body mass index should be between 19-25. Your Body mass index is 36.2 kg/m. If this is out of the aformentioned range listed, please consider follow up with your Primary Care Provider.   ________________________________________________________  The  GI providers would like to encourage you to use [facility] [employer] to communicate with providers for non-urgent requests or questions.  Due to long hold times on the telephone, sending your provider a message by [facility] may be a faster and more efficient way to get a response.  Please allow 48 business hours for a response.  Please remember that this is for non-urgent requests.  _______________________________________________________  Your provider has requested that you go to the basement level for lab work before leaving today. Press "B" on the elevator. The lab is located at the first door on the left as you exit the elevator.  We have given you samples of the following medication to take: Plenvu   Please exercise daily  You have been scheduled for an abdominal ultrasound at [facility] (1st floor of hospital) on   June 9th   at   [place] . Please arrive 15 minutes prior to your appointment for registration. Make certain not to have anything to eat or drink after midnight prior to your appointment. Should you need to reschedule your appointment, please contact radiology at [phone].   You have been scheduled for a colonoscopy. Please follow written instructions given to you at your visit today.  Please pick up your prep supplies at the pharmacy within the next 1-3 days. If you use inhalers (even only as needed), please bring them with you on the day of your procedure.  Thank you,  Dr.  Jackquline Denmark

## 2021-06-26 ENCOUNTER — Encounter: Payer: Self-pay | Admitting: Gastroenterology

## 2021-06-26 ENCOUNTER — Telehealth: Payer: Self-pay | Admitting: Gastroenterology

## 2021-06-26 NOTE — Telephone Encounter (Signed)
Inbound call from patient stating that she does not want her information given to Glendon Axe MD. Patient stated that in her appointment discharge paperwork it has her as patients PCP. Patient stated she is not a provider of hers and does not want information getting to someone she does not know.

## 2021-06-29 ENCOUNTER — Other Ambulatory Visit (HOSPITAL_COMMUNITY): Payer: Self-pay

## 2021-06-29 ENCOUNTER — Encounter: Payer: Self-pay | Admitting: Gastroenterology

## 2021-06-29 LAB — ACUTE VIRAL HEPATITIS (HAV, HBV, HCV)
HCV Ab: NONREACTIVE
Hep A IgM: NEGATIVE
Hep B C IgM: NEGATIVE
Hepatitis B Surface Ag: NEGATIVE

## 2021-06-29 LAB — CELIAC PANEL 10
Antigliadin Abs, IgA: 25 units — ABNORMAL HIGH (ref 0–19)
Endomysial IgA: NEGATIVE
Gliadin IgG: 61 units — ABNORMAL HIGH (ref 0–19)
IgA/Immunoglobulin A, Serum: 689 mg/dL — ABNORMAL HIGH (ref 87–352)
Tissue Transglut Ab: 17 U/mL — ABNORMAL HIGH (ref 0–5)
Transglutaminase IgA: 3 U/mL (ref 0–3)

## 2021-06-29 LAB — HCV INTERPRETATION

## 2021-06-30 NOTE — Telephone Encounter (Signed)
Dr made aware

## 2021-07-01 ENCOUNTER — Telehealth: Payer: Self-pay

## 2021-07-01 LAB — HEPATITIS A ANTIBODY, TOTAL: Hepatitis A AB,Total: REACTIVE — AB

## 2021-07-01 LAB — ANTI-SMOOTH MUSCLE ANTIBODY, IGG: Actin (Smooth Muscle) Antibody (IGG): <20 U (ref <20)

## 2021-07-01 LAB — MITOCHONDRIAL ANTIBODIES: Mitochondrial M2 Ab, IgG: <=20 U (ref <=20.0)

## 2021-07-01 LAB — CERULOPLASMIN: Ceruloplasmin: 41 mg/dL (ref 18–53)

## 2021-07-01 LAB — HEPATITIS B SURFACE ANTIBODY,QUALITATIVE: Hep B S Ab: REACTIVE — AB

## 2021-07-01 LAB — ALPHA-1-ANTITRYPSIN: A-1 Antitrypsin, Ser: 154 mg/dL (ref 83–199)

## 2021-07-01 NOTE — Telephone Encounter (Signed)
Got it Thx RG   Remo Lipps, Pl take off Dr Thompson Caul name as PCP for her chart- if not done already RG

## 2021-07-01 NOTE — Telephone Encounter (Signed)
EGD has been added on with colon rescheduled for 09/01/21. Amy Mason Jim has been notified of change.

## 2021-07-03 ENCOUNTER — Ambulatory Visit (HOSPITAL_COMMUNITY)
Admission: RE | Admit: 2021-07-03 | Discharge: 2021-07-03 | Disposition: A | Discharge status: Home / Self Care | Payer: 59 | Source: Ambulatory Visit | Attending: Gastroenterology | Admitting: Gastroenterology

## 2021-07-03 DIAGNOSIS — R7989 Other specified abnormal findings of blood chemistry: Secondary | ICD-10-CM

## 2021-07-03 DIAGNOSIS — K760 Fatty (change of) liver, not elsewhere classified: Secondary | ICD-10-CM

## 2021-07-07 NOTE — Telephone Encounter (Signed)
Spoke with pt this AM in regard to recent results from imaging. Pt did question Liver Bx. Noted was forwarded to Dr. Lyndel Safe to advise.

## 2021-07-08 ENCOUNTER — Other Ambulatory Visit: Payer: Self-pay

## 2021-07-08 ENCOUNTER — Telehealth: Payer: Self-pay | Admitting: Gastroenterology

## 2021-07-08 DIAGNOSIS — R7989 Other specified abnormal findings of blood chemistry: Secondary | ICD-10-CM

## 2021-07-08 NOTE — Progress Notes (Signed)
P 

## 2021-07-08 NOTE — Telephone Encounter (Signed)
Pt was made aware of Dr. Lyndel Safe recommendations:  Orders for  US guided liver biopsy RE: abn Lfts) entered: Message sent to April Paite and Rhys Martini to schedule procedure and notify pt: Orders for PT/INR, LFTs placed in epic: Pt made aware to come 2-3 days prior to date of [place]. Directions to lab provided:  Pt verbalized understanding with all questions answered.

## 2021-07-08 NOTE — Telephone Encounter (Signed)
Have discussed with patient in detail over the phone. I have discussed the ultrasound/USE results.  She wants to proceed with liver biopsy.  Dr. Glendon Axe was her primary care physician previously.  She has seen her few times.  Per patient she is no longer her physician.  Per patient "would keep on changing physicians" I have told her to let us know who her primary care physician is at each visit (when she checks in)  Also her dad had history of polyps and is getting colonoscopy done every 5 years, starting at age 22.   Plan: -Proceed with US guided liver biopsy RE: abn Lfts).  I have discussed risks and benefits including small but definite risks of bleeding, bowel perforation, lung injury.  I have discussed benefits as well.  She adamantly wishes to proceed. -Check PT/INR, LFTs before. -Avoid fatty foods.   RG

## 2021-07-14 ENCOUNTER — Encounter: Payer: Self-pay | Admitting: Gastroenterology

## 2021-07-17 ENCOUNTER — Telehealth: Payer: Self-pay | Admitting: Gastroenterology

## 2021-07-17 MED ORDER — ONDANSETRON 4 MG PO TBDP: 4.0000 mg | ORAL_TABLET | Freq: Three times a day (TID) | ORAL | 2 refills | Status: AC | PRN

## 2021-07-22 ENCOUNTER — Other Ambulatory Visit (INDEPENDENT_AMBULATORY_CARE_PROVIDER_SITE_OTHER): Payer: 59

## 2021-07-22 DIAGNOSIS — R7989 Other specified abnormal findings of blood chemistry: Secondary | ICD-10-CM

## 2021-07-22 LAB — PROTIME-INR
INR: 1.1 ratio — ABNORMAL HIGH (ref 0.8–1.0)
Prothrombin Time: 11.8 s (ref 9.6–13.1)

## 2021-07-22 LAB — HEPATIC FUNCTION PANEL
ALT: 44 U/L — ABNORMAL HIGH (ref 0–35)
AST: 40 U/L — ABNORMAL HIGH (ref 0–37)
Albumin: 3.9 g/dL (ref 3.5–5.2)
Alkaline Phosphatase: 90 U/L (ref 39–117)
Bilirubin, Direct: 0.1 mg/dL (ref 0.0–0.3)
Total Bilirubin: 0.4 mg/dL (ref 0.2–1.2)
Total Protein: 7.7 g/dL (ref 6.0–8.3)

## 2021-07-23 ENCOUNTER — Other Ambulatory Visit: Payer: Self-pay | Admitting: Student

## 2021-07-23 DIAGNOSIS — R7989 Other specified abnormal findings of blood chemistry: Secondary | ICD-10-CM

## 2021-07-24 ENCOUNTER — Ambulatory Visit (HOSPITAL_COMMUNITY)
Admission: RE | Admit: 2021-07-24 | Discharge: 2021-07-24 | Disposition: A | Discharge status: Home / Self Care | Payer: 59 | Source: Ambulatory Visit | Attending: Gastroenterology | Admitting: Gastroenterology

## 2021-07-24 ENCOUNTER — Other Ambulatory Visit: Payer: Self-pay

## 2021-07-24 ENCOUNTER — Encounter (HOSPITAL_COMMUNITY): Payer: Self-pay

## 2021-07-24 DIAGNOSIS — I341 Nonrheumatic mitral (valve) prolapse: Secondary | ICD-10-CM | POA: Insufficient documentation

## 2021-07-24 DIAGNOSIS — Z90710 Acquired absence of both cervix and uterus: Secondary | ICD-10-CM | POA: Insufficient documentation

## 2021-07-24 DIAGNOSIS — K219 Gastro-esophageal reflux disease without esophagitis: Secondary | ICD-10-CM | POA: Insufficient documentation

## 2021-07-24 DIAGNOSIS — Z90721 Acquired absence of ovaries, unilateral: Secondary | ICD-10-CM | POA: Insufficient documentation

## 2021-07-24 DIAGNOSIS — K7581 Nonalcoholic steatohepatitis (NASH): Secondary | ICD-10-CM | POA: Insufficient documentation

## 2021-07-24 DIAGNOSIS — R7989 Other specified abnormal findings of blood chemistry: Secondary | ICD-10-CM | POA: Insufficient documentation

## 2021-07-24 DIAGNOSIS — J45909 Unspecified asthma, uncomplicated: Secondary | ICD-10-CM | POA: Insufficient documentation

## 2021-07-24 DIAGNOSIS — Z9049 Acquired absence of other specified parts of digestive tract: Secondary | ICD-10-CM | POA: Insufficient documentation

## 2021-07-24 DIAGNOSIS — K709 Alcoholic liver disease, unspecified: Secondary | ICD-10-CM | POA: Diagnosis not present

## 2021-07-24 LAB — CBC WITH DIFFERENTIAL/PLATELET
Abs Immature Granulocytes: 0.06 10*3/uL (ref 0.00–0.07)
Basophils Absolute: 0.1 10*3/uL (ref 0.0–0.1)
Basophils Relative: 1 %
Eosinophils Absolute: 0.1 10*3/uL (ref 0.0–0.5)
Eosinophils Relative: 1 %
HCT: 43.3 % (ref 36.0–46.0)
Hemoglobin: 14 g/dL (ref 12.0–15.0)
Immature Granulocytes: 1 %
Lymphocytes Relative: 45 %
Lymphs Abs: 5.5 10*3/uL — ABNORMAL HIGH (ref 0.7–4.0)
MCH: 27.5 pg (ref 26.0–34.0)
MCHC: 32.3 g/dL (ref 30.0–36.0)
MCV: 84.9 fL (ref 80.0–100.0)
Monocytes Absolute: 0.7 10*3/uL (ref 0.1–1.0)
Monocytes Relative: 6 %
Neutro Abs: 5.8 10*3/uL (ref 1.7–7.7)
Neutrophils Relative %: 46 %
Platelets: 351 10*3/uL (ref 150–400)
RBC: 5.1 MIL/uL (ref 3.87–5.11)
RDW: 13.9 % (ref 11.5–15.5)
WBC: 12.2 10*3/uL — ABNORMAL HIGH (ref 4.0–10.5)
nRBC: 0 % (ref 0.0–0.2)

## 2021-07-24 LAB — COMPREHENSIVE METABOLIC PANEL
ALT: 37 U/L (ref 0–44)
AST: 23 U/L (ref 15–41)
Albumin: 3.7 g/dL (ref 3.5–5.0)
Alkaline Phosphatase: 91 U/L (ref 38–126)
Anion gap: 9 (ref 5–15)
BUN: 19 mg/dL (ref 6–20)
CO2: 28 mmol/L (ref 22–32)
Calcium: 9.2 mg/dL (ref 8.9–10.3)
Chloride: 104 mmol/L (ref 98–111)
Creatinine, Ser: 0.72 mg/dL (ref 0.44–1.00)
GFR, Estimated: >60 mL/min (ref >60)
Glucose, Bld: 172 mg/dL — ABNORMAL HIGH (ref 70–99)
Potassium: 3.5 mmol/L (ref 3.5–5.1)
Sodium: 141 mmol/L (ref 135–145)
Total Bilirubin: 0.4 mg/dL (ref 0.3–1.2)
Total Protein: 8.4 g/dL — ABNORMAL HIGH (ref 6.5–8.1)

## 2021-07-24 LAB — PROTIME-INR
INR: 0.9 (ref 0.8–1.2)
Prothrombin Time: 12 seconds (ref 11.4–15.2)

## 2021-07-24 MED ORDER — SODIUM CHLORIDE 0.9 % IV SOLN: INTRAVENOUS | Status: DC

## 2021-07-24 MED ORDER — FENTANYL CITRATE (PF) 100 MCG/2ML IJ SOLN
INTRAMUSCULAR | Status: AC | PRN
  Administered 2021-07-24 (×2): 50 ug via INTRAVENOUS

## 2021-07-24 MED ORDER — LIDOCAINE HCL 1 % IJ SOLN
INTRAMUSCULAR | Status: AC
  Filled 2021-07-24: qty 20

## 2021-07-24 MED ORDER — ACETAMINOPHEN 325 MG PO TABS
650.0000 mg | ORAL_TABLET | Freq: Once | ORAL | Status: AC
  Administered 2021-07-24: 650 mg via ORAL

## 2021-07-24 MED ORDER — FENTANYL CITRATE (PF) 100 MCG/2ML IJ SOLN
INTRAMUSCULAR | Status: AC
  Filled 2021-07-24: qty 2

## 2021-07-24 MED ORDER — MIDAZOLAM HCL 2 MG/2ML IJ SOLN
INTRAMUSCULAR | Status: AC | PRN
  Administered 2021-07-24 (×2): 1 mg via INTRAVENOUS

## 2021-07-24 MED ORDER — GELATIN ABSORBABLE 12-7 MM EX MISC
CUTANEOUS | Status: AC
  Filled 2021-07-24: qty 1

## 2021-07-24 MED ORDER — ACETAMINOPHEN 325 MG PO TABS
ORAL_TABLET | ORAL | Status: AC
  Filled 2021-07-24: qty 2

## 2021-07-24 MED ORDER — MIDAZOLAM HCL 2 MG/2ML IJ SOLN
INTRAMUSCULAR | Status: AC
  Filled 2021-07-24: qty 2

## 2021-07-24 NOTE — Consult Note (Addendum)
Chief Complaint: Patient was seen in consultation today for  image guided random core liver biopsy  Referring Physician(s): [place]  Supervising Physician: Aletta Edouard  Patient Status: [facility] - Out-pt  History of Present Illness: Christina Parker is a 46 y.o. female with past medical history significant for anemia, RA, asthma, GERD, mitral valve prolapse, vitamin B and D deficiencies, prior cholecystectomy, fatty liver  who presents now with elevated liver function tests, persistent abd/pelvic pain. MRI A/P on 06/07/21 revealed:  1. No acute findings in the abdomen or pelvis. 2. Severe diffuse hepatic steatosis, which in the setting of steatohepatitis can be a cause of pain. 3. Status post hysterectomy. The ovaries are not confidently identified may be surgically absent.  She is scheduled for image guided random core liver biopsy today for further evaluation.   Past Medical History:  Diagnosis Date   Anemia    Asthma    exercise induction and seonal allerggies casuse flars   Constipation    Family history of anesthesia complication    pt's daughter has PO N&V   GERD (gastroesophageal reflux disease)    Heart murmur    MVP   History of vitamin B deficiency    History of vitamin D deficiency    MVP (mitral valve prolapse)    Pneumonia    Shortness of breath dyspnea    with abdominal bloating since Jan 2017- "gallbladder"   Vaginal delivery 1997, 2005    Past Surgical History:  Procedure Laterality Date   ABDOMINAL HYSTERECTOMY     May 2021 - removal of uterus, cervix and one Ovary ( Uterus Mass) , june 22 - revoval of other ovary due to regrowth   CHOLECYSTECTOMY N/A 04/24/2015   Procedure: LAPAROSCOPIC CHOLECYSTECTOMY WITH INTRAOPERATIVE CHOLANGIOGRAM;  Surgeon: Erroll Luna, MD;  Location: [place];  Service: General;  Laterality: N/A;   [place] N/A 08/02/2013   Procedure: DILATATION & CURETTAGE/HYSTEROSCOPY  WITH NOVASURE ABLATION;  Surgeon: Marylynn Pearson, MD;  Location: [facility];  Service: Gynecology;  Laterality: N/A;   TUBAL LIGATION  2005    Allergies: Patient has no known allergies.  Medications: Prior to Admission medications   Medication Sig Start Date End Date Taking? Authorizing Provider  albuterol (PROVENTIL HFA;VENTOLIN HFA) 108 (90 BASE) MCG/ACT inhaler Inhale 2 puffs into the lungs every 4 (four) hours as needed for wheezing or shortness of breath (cough, shortness of breath or wheezing.). 12/07/13  Yes Roselee Culver, MD  Ascorbic Acid (VITAMIN C) 1000 MG tablet Take 1,000 mg by mouth daily.   Yes [provider]  cetirizine (ZYRTEC) 10 MG tablet Take 10 mg by mouth daily.   Yes [provider]  fluticasone (FLONASE) 50 MCG/ACT nasal spray Place 2 sprays into both nostrils daily. 02/18/14  Yes Chesley Mires, MD  ondansetron (ZOFRAN-ODT) 4 MG disintegrating tablet Take 1 tablet (4 mg total) by mouth every 8 (eight) hours as needed for nausea or vomiting. 07/17/21  Yes Jackquline Denmark, MD  pantoprazole (PROTONIX) 40 MG tablet TAKE 1 TABLET (40 MG TOTAL) BY MOUTH DAILY. USE AS NEEDED FOR GERD 02/06/20  Yes Copland, Gay Filler, MD  pregabalin (LYRICA) 75 MG capsule Take 75 mg by mouth 2 (two) times daily as needed. 06/01/21  Yes [provider]     Family History  Problem Relation Age of Onset   Allergies Mother    Diabetes Sister    Diabetes Maternal Grandmother    Hypertension Maternal Grandmother  Hypertension Maternal Grandfather    Diabetes Paternal Grandmother    Hypertension Paternal Grandmother    Diabetes Paternal Grandfather    Hypertension Paternal Grandfather    Breast cancer Daughter 40   Asthma Son    Allergies Son    Stomach cancer Neg Hx    Colon cancer Neg Hx    Rectal cancer Neg Hx     Social History   Socioeconomic History   Marital status: Married    Spouse name: Not on file   Number of children: 2   Years of education:  Not on file   Highest education level: Not on file  Occupational History   Occupation: Therapist, sports, Home Health  Tobacco Use   Smoking status: Never   Smokeless tobacco: Never   Tobacco comments:    Exposed x 11 years. Pt works in home health  Vaping Use   Vaping Use: Never used  Substance and Sexual Activity   Alcohol use: No    Alcohol/week: 0.0 standard drinks of alcohol   Drug use: No   Sexual activity: Not on file  Other Topics Concern   Not on file  Social History Narrative   Married   Manufacturing systems engineer   Social Determinants of Health   Financial Resource Strain: Not on file  Food Insecurity: Not on file  Transportation Needs: Not on file  Physical Activity: Not on file  Stress: Not on file  Social Connections: Not on file     Review of Systems currently denies fever, headache, chest pain, dyspnea, cough, back pain, vomiting or bleeding; does have abdominal and pelvic discomfort as well as occasional nausea.  Vital Signs: BP (!) 145/96   Pulse 87   Temp 99 F (37.2 C) (Oral)   Resp 18   Ht 5' 7"  (1.702 m)   Wt 230 lb (104.3 kg)   LMP 07/08/2013   SpO2 100%   BMI 36.02 kg/m     Physical Exam awake, alert.  Chest clear to auscultation bilaterally.  Heart with regular rate and rhythm.  Abdomen soft, positive bowel sounds, some  mid abdominal tenderness to palpation; no lower extremity edema  Imaging: US ABDOMEN COMPLETE W/ELASTOGRAPHY  Result Date: 07/03/2021 CLINICAL DATA:  Fatty liver, abnormal LFTs EXAM: ULTRASOUND ABDOMEN ULTRASOUND HEPATIC ELASTOGRAPHY TECHNIQUE: Sonography of the upper abdomen was performed. In addition, ultrasound elastography evaluation of the liver was performed. A region of interest was placed within the right lobe of the liver. Following application of a compressive sonographic pulse, tissue compressibility was assessed. Multiple assessments were performed at the selected site. Median tissue compressibility was determined.  Previously, hepatic stiffness was assessed by shear wave velocity. Based on recently published Society of Radiologists in Ultrasound consensus article, reporting is now recommended to be performed in the SI units of pressure (kiloPascals) representing hepatic stiffness/elasticity. The obtained result is compared to the published reference standards. (cACLD = compensated Advanced Chronic Liver Disease) COMPARISON:  CT abdomen and pelvis 03/21/2019 FINDINGS: ULTRASOUND ABDOMEN Gallbladder: Surgically absent Common bile duct: Diameter: Normal caliber 4 mm diameter Liver: Echogenic parenchyma, likely fatty infiltration though this can be seen with cirrhosis and certain infiltrative disorders. No gross focal hepatic mass or nodularity, though assessment of intrahepatic detail is limited by sound attenuation. Portal vein is patent on color Doppler imaging with normal direction of blood flow towards the liver. IVC: Normal appearance Pancreas: Tail obscured by bowel gas. Visualized pancreas echogenic likely fatty replacement. No focal mass. Spleen:  Normal appearance, 10.0 cm length Right Kidney: Length: 10.7 cm. Normal morphology without mass or hydronephrosis. Left Kidney: Length: 10.3 cm. Normal morphology without mass or hydronephrosis. Abdominal aorta: Normal caliber Other findings: No RIGHT upper quadrant free fluid. ULTRASOUND HEPATIC ELASTOGRAPHY Device: Siemens Helix VTQ Patient position: Supine Transducer 9C2 Number of measurements: 12 Hepatic segment:  8 Median kPa: 8.9 IQR: 3.9 IQR/Median kPa ratio: 0.51 Data quality:  Good Diagnostic category: < or = 9 kPa: in the absence of other known clinical signs, rules out cACLD The use of hepatic elastography is applicable to patients with viral hepatitis and non-alcoholic fatty liver disease. At this time, there is insufficient data for the referenced cut-off values and use in other causes of liver disease, including alcoholic liver disease. Patients, however, may be  assessed by elastography and serve as their own reference standard/baseline. In patients with non-alcoholic liver disease, the values suggesting compensated advanced chronic liver disease (cACLD) may be lower, and patients may need additional testing with elasticity results of 7-9 kPa. Please note that abnormal hepatic elasticity and shear wave velocities may also be identified in clinical settings other than with hepatic fibrosis, such as: acute hepatitis, elevated right heart and central venous pressures including use of beta blockers, veno-occlusive disease (Budd-Chiari), infiltrative processes such as mastocytosis/amyloidosis/infiltrative tumor/lymphoma, extrahepatic cholestasis, with hyperemia in the post-prandial state, and with liver transplantation. Correlation with patient history, laboratory data, and clinical condition recommended. Diagnostic Categories: < or =5 kPa: high probability of being normal < or =9 kPa: in the absence of other known clinical signs, rules out cACLD >9 kPa and ?13 kPa: suggestive of cACLD, but needs further testing >13 kPa: highly suggestive of cACLD > or =17 kPa: highly suggestive of cACLD with an increased probability of clinically significant portal hypertension IMPRESSION: ULTRASOUND ABDOMEN: Echogenic liver without mass or nodularity, question fatty infiltration versus cirrhosis. Post cholecystectomy. Obscuration of pancreatic tail. ULTRASOUND HEPATIC ELASTOGRAPHY: Median kPa:  8.9 Diagnostic category: < or = 9 kPa: in the absence of other known clinical signs, rules out cACLD Note: In patients with non-alcoholic liver disease, the values suggesting compensated advanced chronic liver disease (cACLD) may be lower, and patients may need additional testing with elasticity results of 7-9 kPa. Electronically Signed   By: Lavonia Dana M.D.   On: 07/03/2021 13:06    Labs:  CBC: Recent Labs    06/25/21 1110  WBC 7.0  HGB 13.4  HCT 41.0  PLT 310.0    COAGS: Recent Labs     07/22/21 1621  INR 1.1*    BMP: Recent Labs    06/25/21 1110  NA 140  K 3.6  CL 102  CO2 25  GLUCOSE 144*  BUN 11  CALCIUM 9.9  CREATININE 0.80    LIVER FUNCTION TESTS: Recent Labs    06/25/21 1110 07/22/21 1621  BILITOT 0.4 0.4  AST 49* 40*  ALT 39* 44*  ALKPHOS 86 90  PROT 8.5* 7.7  ALBUMIN 4.2 3.9    TUMOR MARKERS: No results for input(s): "AFPTM", "CEA", "CA199", "CHROMGRNA" in the last 8760 hours.  Assessment and Plan: 46 y.o. female with past medical history significant for anemia, RA, asthma, GERD, mitral valve prolapse, vitamin B and D deficiencies, prior cholecystectomy, fatty liver  who presents now with elevated liver function tests, persistent abd/pelvic pain. MRI A/P on 06/07/21 revealed:  1. No acute findings in the abdomen or pelvis. 2. Severe diffuse hepatic steatosis, which in the setting of steatohepatitis can be a cause of  pain. 3. Status post hysterectomy. The ovaries are not confidently identified may be surgically absent.  She is scheduled for image guided random core liver biopsy today for further evaluation.   Thank you for this interesting consult.  I greatly enjoyed meeting MADELLINE ESHBACH and look forward to participating in their care.  A copy of this report was sent to the requesting provider on this date.  Electronically Signed: D. Rowe Robert, PA-C 07/24/2021, 11:20 AM   I spent a total of  25 minutes   in face to face in clinical consultation, greater than 50% of which was counseling/coordinating care for image guided random core liver biopsy

## 2021-07-24 NOTE — Discharge Instructions (Addendum)
If you have questions or concerns you can call Interventional Radiology at [phone]   Keep site clean /dry- No shower until tomorrow afternoon.    Moderate Conscious Sedation, Adult, Care After This sheet gives you information about how to care for yourself after your procedure. Your health care provider may also give you more specific instructions. If you have problems or questions, contact your health care provider. What can I expect after the procedure? After the procedure, it is common to have: Sleepiness for several hours. Impaired judgment for several hours. Difficulty with balance. Vomiting if you eat too soon. Follow these instructions at home: For the time period you were told by your health care provider:     Rest. Do not participate in activities where you could fall or become injured. Do not drive or use machinery. Do not drink alcohol. Do not take sleeping pills or medicines that cause drowsiness. Do not make important decisions or sign legal documents. Do not take care of children on your own. Eating and drinking  Follow the diet recommended by your health care provider. Drink enough fluid to keep your urine pale yellow. If you vomit: Drink water, juice, or soup when you can drink without vomiting. Make sure you have little or no nausea before eating solid foods. General instructions Take over-the-counter and prescription medicines only as told by your health care provider. Have a responsible adult stay with you for the time you are told. It is important to have someone help care for you until you are awake and alert. Do not smoke. Keep all follow-up visits as told by your health care provider. This is important. Contact a health care provider if: You are still sleepy or having trouble with balance after 24 hours. You feel light-headed. You keep feeling nauseous or you keep vomiting. You develop a rash. You have a fever. You have redness or swelling  around the IV site. Get help right away if: You have trouble breathing. You have new-onset confusion at home. Summary After the procedure, it is common to feel sleepy, have impaired judgment, or feel nauseous if you eat too soon. Rest after you get home. Know the things you should not do after the procedure. Follow the diet recommended by your health care provider and drink enough fluid to keep your urine pale yellow. Get help right away if you have trouble breathing or new-onset confusion at home. This information is not intended to replace advice given to you by your health care provider. Make sure you discuss any questions you have with your health care provider. Document Revised: 05/11/2019 Document Reviewed: 12/07/2018 Elsevier Patient Education  [place].      Liver Biopsy, Care After After a liver biopsy, it is common to have these things in the area where the biopsy was done. You may: Have pain. Feel sore. Have bruising. You may also feel tired for a few days. Follow these instructions at home: Medicines Take over-the-counter and prescription medicines only as told by your doctor. If you were prescribed an antibiotic medicine, take it as told by your doctor. Do not stop taking the antibiotic, even if you start to feel better. Do not take medicines that may thin your blood. These medicines include aspirin and ibuprofen. Take them only if your doctor tells you to. If told, take steps to prevent problems with pooping (constipation). You may need to: Drink enough fluid to keep your pee (urine) pale yellow. Take medicines. You will be told what  medicines to take. Eat foods that are high in fiber. These include beans, whole grains, and fresh fruits and vegetables. Limit foods that are high in fat and sugar. These include fried or sweet foods. Ask your doctor if you should avoid driving or using machines while you are taking your medicine. Caring for your incision Follow  instructions from your doctor about how to take care of your cut from surgery (incisions). Make sure you: Wash your hands with soap and water for at least 20 seconds before and after you change your bandage. If you cannot use soap and water, use hand sanitizer. Change your bandage. Leavestitches or skin glue in place for at least two weeks. Leave tape strips alone unless you are told to take them off. You may trim the edges of the tape strips if they curl up. Check your incision every day for signs of infection. Check for: Redness, swelling, or more pain. Fluid or blood. Warmth. Pus or a bad smell. Do not take baths, swim, or use a hot tub. Ask your doctor about taking showers or sponge baths. Activity Rest at home for 1-2 days, or as told by your doctor. Get up to take short walks every 1 to 2 hours. Ask for help if you feel weak or unsteady. Do not lift anything that is heavier than 10 lb (4.5 kg), or the limit that you are told. Do not play contact sports for 2 weeks after the procedure. Return to your normal activities as told by your doctor. Ask what activities are safe for you. General instructions Do not drink alcohol in the first week after the procedure. Plan to have a responsible adult care for you for the time you are told after you leave the hospital or clinic. This is important. It is up to you to get the results of your procedure. Ask how to get your results when they are ready. Keep all follow-up visits.   Contact a doctor if: You have more bleeding in your incision. Your incision swells, or is red and more painful. You have fluid that comes from your incision. You develop a rash. You have fever or chills. Get help right away if: You have swelling, bloating, or pain in your belly (abdomen). You get dizzy or faint. You vomit or you feel like vomiting. You have trouble breathing or feel short of breath. You have chest pain. You have problems talking or seeing. You  have trouble with your balance or moving your arms or legs. These symptoms may be an emergency. Get help right away. Call your local emergency services (911 in the [place] Do not wait to see if the symptoms will go away. Do not drive yourself to the hospital. Summary After the procedure, it is common to have pain, soreness, bruising, and tiredness. Your doctor will tell you how to take care of yourself at home. Change your bandage, take your medicines, and limit your activities as told by your doctor. Call your doctor if you have symptoms of infection. Get help right away if your belly swells, your cut bleeds a lot, or you have trouble talking or breathing. This information is not intended to replace advice given to you by your health care provider. Make sure you discuss any questions you have with your healthcare provider. Document Revised: 11/26/2019 Document Reviewed: 11/26/2019 Elsevier Patient Education  2022 [employer].

## 2021-07-24 NOTE — Progress Notes (Signed)
Patient states abd pain- soreness/ ache.  Informed K Allred PA. Orders for Tylenol

## 2021-07-24 NOTE — Procedures (Signed)
Interventional Radiology Procedure Note  Procedure: US Guided Biopsy of Liver  Complications: None  Estimated Blood Loss: < 10 mL  Findings: 18 G core biopsy of liver performed under US guidance.  Three core samples obtained and sent to Pathology.  Venetia Night. Kathlene Cote, M.D Pager:  [phone]

## 2021-07-28 LAB — SURGICAL PATHOLOGY

## 2021-07-30 ENCOUNTER — Encounter: Payer: Self-pay | Admitting: Gastroenterology

## 2021-07-30 ENCOUNTER — Telehealth: Payer: Self-pay | Admitting: Gastroenterology

## 2021-07-31 ENCOUNTER — Telehealth: Payer: Self-pay | Admitting: Gastroenterology

## 2021-07-31 ENCOUNTER — Encounter: Payer: Self-pay | Admitting: Gastroenterology

## 2021-07-31 NOTE — Telephone Encounter (Signed)
I spoke with Ms. Christina Parker. She is upset that she has not received a call back about her pathology results.  I notified her that Dr. Lyndel Safe has been out of the office and that he will return on Monday .  She is reassured that there are no worrisome findings and that we will call her next week as soon as Dr. Lyndel Safe has reviewed.    She would like to have her pathology slides sent to [facility] for a second opinion of the read.  Dr. Lyndel Safe, please let me know when you have reviewed the results and I will call the patient back personally, but she would also be appreciative if you were able to call her personally.  She has a hx of cancer and is very anxious to hear the results.

## 2021-07-31 NOTE — Telephone Encounter (Addendum)
Inbound call from patient stating that she called yesterday and was told that the nurse was going to give her a call back and never heard anything. Patient stated that she is wanting to know the results of her liver biopsy. I advised patient that the nurse was with a patient and whenever the nurse got the chance she would receive a call. Patient stated " you better hope I get a call or else I will be on my way to speak to all of you  in the building, she is not scared of anything or anyone and she has the email to the [facility] and she will be glad to reach out to them". Patient wanted to make a follow up appointment due to the " run around" that we have been giving her. Patient requested a follow appointment with Dr. Lyndel Safe, patient was scheduled for 8/18 at 9:30. Patient is also scheduled to have both endo and colon 8/8. Please advise.

## 2021-07-31 NOTE — Telephone Encounter (Signed)
Spoke to pt. Documented in additional phone note:

## 2021-07-31 NOTE — Telephone Encounter (Signed)
Spoke to pt over the Phone. Documented in separate phone note

## 2021-07-31 NOTE — Telephone Encounter (Signed)
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 Pt stated "Try Me" Pt stated that if she does not here anything back before 5 PM then she will be at our building at 8:00 AM Monday Morning: Pt request to speak to office manager: Call was transferred to [facility] RN:  Please advise

## 2021-08-03 ENCOUNTER — Encounter: Payer: 59 | Admitting: Gastroenterology

## 2021-08-03 NOTE — Progress Notes (Signed)
Discussed in detail over the phone regarding liver biopsy results No fibrosis or cirrhosis. Very early stage mildly active steatohepatitis.  We have discussed weight loss.  She has been doing much better and has been able to reduce 10 pounds.  She has been avoiding fatty foods and has started exercising.  Her most recent LFTs were normal.  I have encouraged her to continue healthy eating and exercising. She wanted to have liver bx sent for another opinion.  She will let us know where. (Not [facility] at this time) I have offered her appointment with hepatology at tertiary care center.  She wants to hold off.  RG

## 2021-08-06 ENCOUNTER — Telehealth: Payer: Self-pay

## 2021-08-06 NOTE — Telephone Encounter (Signed)
Received fax document from [facility]:   [facility] notified of the request from the pt to have a PML Pathology review recent pathology:  Christina Parker at [facility] stated that  the requesting pathology  typically sent a packing slip as well:  Spoke to Christina Parker at [facility]: Christina Parker notified of Christina Parker request: Christina Parker notified to send previous fax that was sent to me along with packing slip directly to [facility]   Fax [phone]: Christina Parker Phone number as well: [phone]:

## 2021-08-12 ENCOUNTER — Telehealth: Payer: Self-pay | Admitting: Gastroenterology

## 2021-08-12 NOTE — Progress Notes (Signed)
Discharge initiated

## 2021-08-12 NOTE — Telephone Encounter (Signed)
Telephone call. Have discussed with patient in detail  -Patient very upset that pathology cannot be sent to [facility] directly from [facility]  She does not trust [employer].  She told me repeatedly that she is "paying" for it.  She is not sure if she wants to go through endoscopic procedures. I told her that we have to follow our own procedures and rules. She was not satisfied.  -She is also upset that it is taking very long for liver biopsy slides to be sent to [facility] from [facility] for second opinion. "Proper paperwork not filled up".   She is very suspicious of her medical care with [place] and is clearly not satisfied with care.  I told her it is very unfortunate, we have tried our best to help her.  I have discussed with Sheri.  She needs to find a different gastroenterologist whom she can trust more and may be a different Endo center with [facility]  R Lyndel Safe

## 2021-08-12 NOTE — Telephone Encounter (Signed)
I clarified with Dr. Lyndel Safe that she is to be discharged from the practice.  I have initiated this.  Left message for patient to call back to have her call back to discuss that we are not able to accommodate her request to have the pathology specimens from future endoscopy procedure performed in the [place].

## 2021-08-18 ENCOUNTER — Other Ambulatory Visit: Payer: Self-pay

## 2021-08-20 ENCOUNTER — Telehealth: Payer: Self-pay | Admitting: Gastroenterology

## 2021-08-20 NOTE — Telephone Encounter (Signed)
Patient dismissed from [facility] by Jackquline Denmark, MD, effective 08/12/21. Dismissal Letter sent out by 1st class mail. [employer]

## 2021-08-21 ENCOUNTER — Telehealth: Payer: Self-pay | Admitting: Family Medicine

## 2021-08-21 NOTE — Telephone Encounter (Signed)
Pt stated she is needing a chart correction and has already notified IT and is needing Dr. Lorelei Pont to correct this. She stated she sent a message via mychart that went to IT first and should then be routed to [place]. She stated Dr.Copland would know what she was talking about. Forms placed in folder up front.

## 2021-08-24 ENCOUNTER — Encounter: Payer: Self-pay | Admitting: Family Medicine

## 2021-08-24 NOTE — Telephone Encounter (Signed)
I received a handwritten note from patient stating that my chart documentation regarding concerns about discrimination caused her health to be negatively affected.  She was concerned about discrimination regarding her pathology evaluation back in 2021.  Please see my note from October 2021.  I am not certain which documentation the patient is concerned about.  I sent her a MyChart message asking her to please clarify.  Her handwritten note will be scanned into the chart  JC

## 2021-08-24 NOTE — Telephone Encounter (Signed)
Letter in folder for review.

## 2021-09-01 ENCOUNTER — Encounter: Payer: 59 | Admitting: Gastroenterology

## 2021-09-11 ENCOUNTER — Ambulatory Visit: Payer: 59 | Admitting: Gastroenterology

## 2021-09-26 NOTE — Telephone Encounter (Signed)
Pl see prev

## 2021-10-08 ENCOUNTER — Telehealth: Payer: Self-pay

## 2021-10-08 NOTE — Telephone Encounter (Signed)
Patient called for an appointment with Dr Berline Lopes.  In researching the last note in our system, the patient doesn't have a gyn cancer diagnosis and would need to have follow up with a regular gyn.  Per Lenna Sciara, NP she doesn't need to be seen in our office.  Returned patient phone call to refer her to couple of local offices and explained to her that she doesn't need to be seen in our office here and she stated " so let me get this straight you are refusing to let me see Dr Berline Lopes and refusing treatment." I stated "No we aren't refusing to let you see her, we are going based off the last note in our system that states you don't have a gyn cancer and don't need to be seen in our office." She went on to explain about the diagnosis that the last Dr stated she had and that she had paperwork to show that her insurance paid for a cancer diagnosis.  I told her I would speak with Dr Berline Lopes and Joylene John NP and somebody would return her call.

## 2021-10-12 ENCOUNTER — Telehealth: Payer: Self-pay | Admitting: Gynecologic Oncology

## 2021-10-12 NOTE — Telephone Encounter (Signed)
My office received a referral received from Dr. Nancy Fetter (patient's primary care) today, just before noon (after patient presented to the cancer center). The referral was for "history of ovarian cancer and pelvic pain."  I spent approximately 30 minutes reviewing the patient's medical record. I reviewed notes from gynecologic oncology at [facility] and [facility]. I reviewed op notes from 05/2019 when patient underwent RA-TLH, LSO and RS; and notes from 06/2020 when she underwent RA-RO, fulguration of left pelvic sidewall endometriosis . I also reviewed pre and post-operative notes and pathology reports from both institutions. Pathology from her surgery at [facility] in 05/2019: right fallopian tube with paratubal cyst, left ovary with hemorrhagic corpus luteum cyst, left fallopian tube with paratubal cyst, no evidence of malignancy in either fallopian tube or the left ovary. Extensive adhesions, c/w history of prior endometrial ablation, noted of the endometrium. Myometrium with adenomyosis and leiomyomata (largest 2.2 cm), cervix and serosa without pathologic diagnosis. Pelvic washings were negative (no evidence of malignancy). Pathology from her surgery at [facility] in 06/2020: cystic hemorrhagic corpus lutea of the right ovary, no evidence of malignancy.   The patient had a pelvic MRI in 05/2021 with no significant pelvic findings. CT A/P in 04/2021 (at [facility], so I'm unable to see these images) notes no free fluid, no adenopathy, no acute abdominal or pelvic findings.   Based on my review of her records, this patient has a history of endometriosis, now s/p definitive surgery (total hysterectomy with BSO). I am unable to find documentation of a history of ovarian cancer. There are notes that indicate that an ovarian cancer diagnosis was erroneously linked to her surgical encounter at [facility] in 2022. This diagnosis was removed from her chart as soon as it was discovered to be an error, and this error was discussed with the patient  at her post-operative visit.   Given no history of ovarian cancer with known history of endometriosis (s/p definitive surgery) with ongoing pelvic pain, I recommended to Dr. Nancy Fetter that the patient would benefit from a referral to a pelvic pain specialist. Since I am not a specialist in pelvic pain and I do not see any documentation that supports a history of ovarian cancer, I think am I not best equipped to treat the patient's current symptoms. In addition to pelvic pain specialist (such as [facility]), I recommended that a referral could be placed for pelvic floor physical therapy.   Given her relationship with the patient, I asked if Dr. Lynnda Child office could reach out to the patient to discuss plan for referrals better aimed at addressing her symptoms.   Jeral Pinch MD Gynecologic Oncology

## 2021-10-13 NOTE — Telephone Encounter (Signed)
Patient called to follow up on scheduling appointment.  Contacted Alphonzo Grieve Careers adviser) at [phone] to ask for instructions on what to do.  Per Joellen Jersey, was told to inform patient that Dr Berline Lopes had reviewed the referral and also spoke with her PCP and that she should hear from her PCP.

## 2021-11-09 ENCOUNTER — Telehealth: Payer: Self-pay

## 2021-11-09 NOTE — Telephone Encounter (Signed)
Pt called office triage line stating Dr. Roseanne Reno PA referred her to our office. She states it has been about a week to a week and a half and she hasn't heard anything from our office. She states she would like a call back with an appointment. She states it would not be a problem for her to stop by the cancer center and talk directly to someone if she needs to. CB# [phone]

## 2021-11-30 DIAGNOSIS — K769 Liver disease, unspecified: Secondary | ICD-10-CM | POA: Insufficient documentation

## 2021-11-30 DIAGNOSIS — K7581 Nonalcoholic steatohepatitis (NASH): Secondary | ICD-10-CM | POA: Insufficient documentation

## 2022-02-04 IMAGING — MR MR PELVIS WO/W CM
13 of 19 series · 30 of 48 positions shown · IV contrast (multihance)
Comparison: Outside ultrasound, 05/21/2020, outside CT, 05/02/2020

CLINICAL DATA: Fibroids, hysterectomy, pelvic mass

EXAM:
MRI PELVIS WITHOUT AND WITH CONTRAST
TECHNIQUE: Multiplanar multisequence MR imaging of the pelvis was performed
both before and after administration of intravenous contrast.
CONTRAST:  20mL MULTIHANCE GADOBENATE DIMEGLUMINE 529 MG/ML IV SOLN

[Series 3: T2 · coronal · 6.0mm · 1.56mm/px · 1 of 24 slices shown (1 of 4)]
[im 1/24]
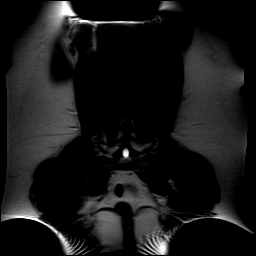

[Series 6: T2 · sagittal · 5.0mm · 0.94mm/px · 1 of 25 slices shown (2 of 4)]
[im 1/25]
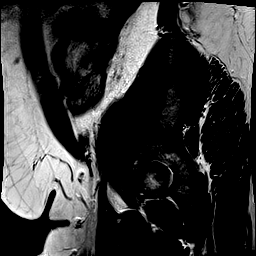

[Series 7: T2 · coronal · 3.0mm · 0.81mm/px · 2 of 41 slices shown (3 of 4)]
[im 1/41]
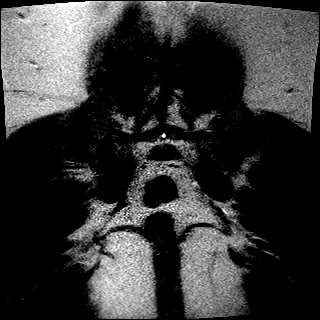
[im 41/41]
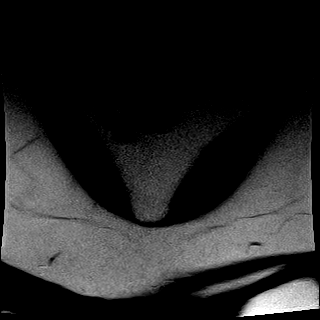

[Series 8: DWI · axial · 5.0mm · 1.70mm/px · z∈[-34,+140]mm · 5 of 90 slices shown]
[im 1/90]
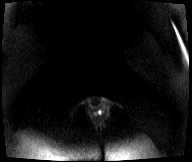
[im 23/90]
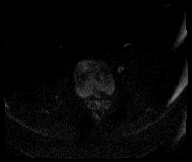
[im 45/90]
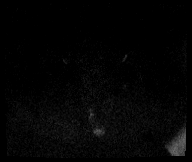
[im 67/90]
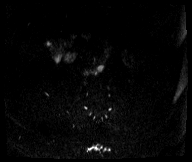
[im 90/90]
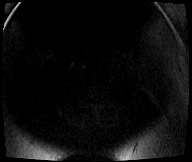

[Series 9: axial dwi_adc · axial · 5.0mm · 1.70mm/px · z∈[-34,+140]mm · 2 of 30 slices shown]
[im 1/30]
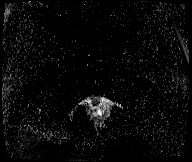
[im 30/30]
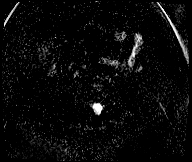

[Series 10: T2 · axial · 5.0mm · 1.09mm/px · z∈[-36,+132]mm · 2 of 29 slices shown (4 of 4)]
[im 1/29]
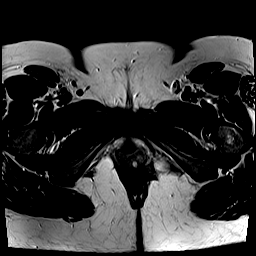
[im 29/29]
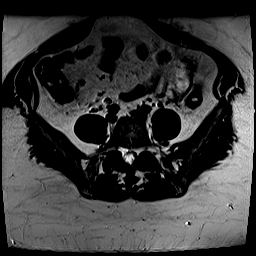

[Series 11: T2 fat-sat · axial · 5.0mm · 0.94mm/px · z∈[-36,+132]mm · 2 of 29 slices shown]
[im 1/29]
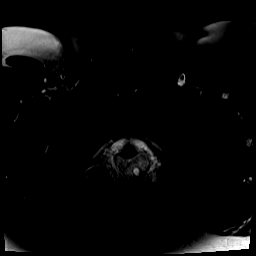
[im 29/29]
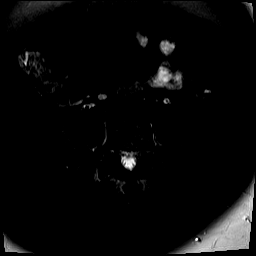

[Series 12: axial in out · axial · 5.5mm · 0.55mm/px · z∈[+64,+152]mm · 2 of 30 slices shown]
[im 1/30]
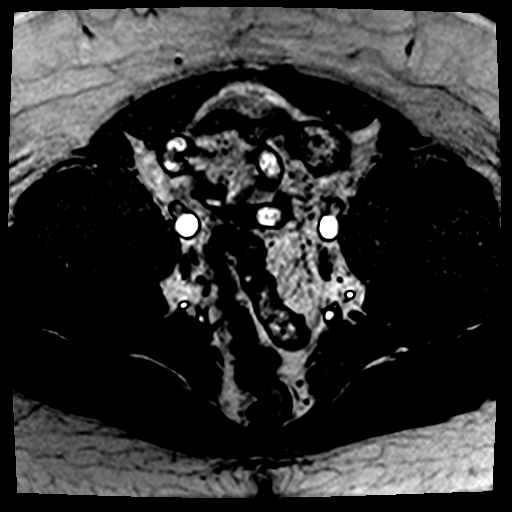
[im 30/30]
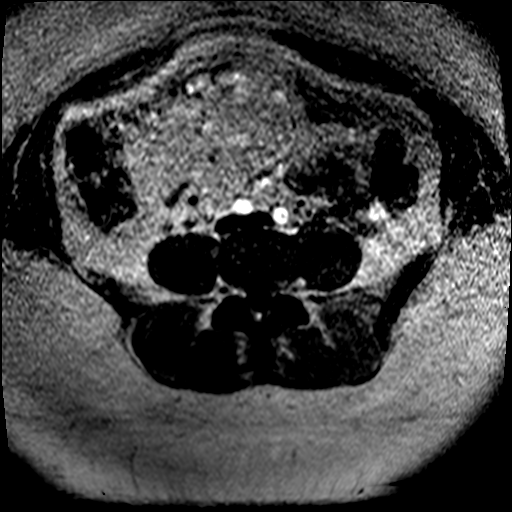

[Series 14: T1 dynamic · axial · non-contrast · 4.0mm · 0.49mm/px · z∈[-14,+142]mm · 3 of 40 slices shown (1 of 3)]
[im 1/40]
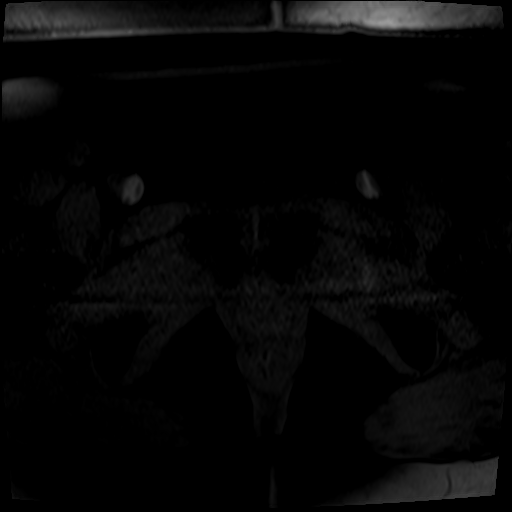
[im 20/40]
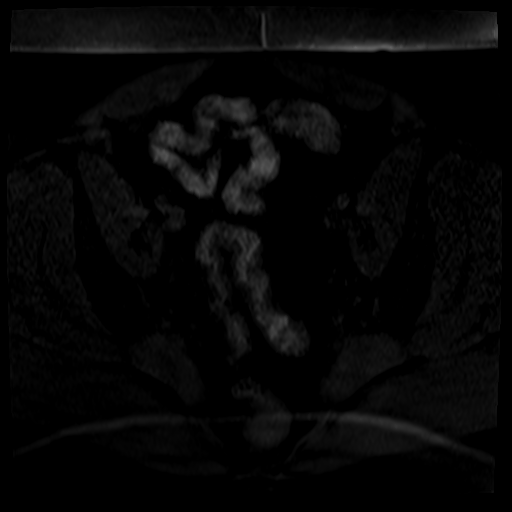
[im 40/40]
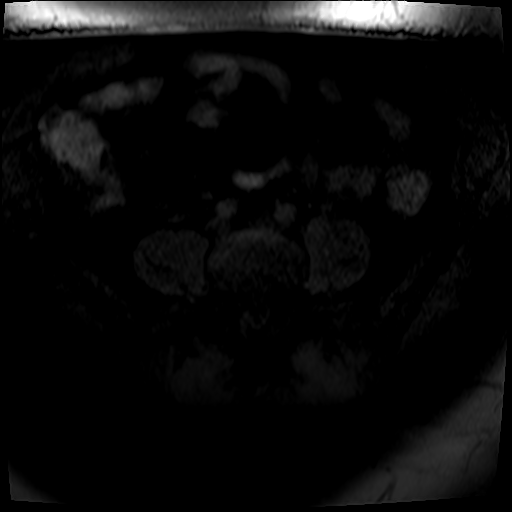

[Series 15: T1 dynamic post-contrast · axial · 4.0mm · 0.49mm/px · z∈[-14,+142]mm · 3 of 40 slices shown (1 of 2)]
[im 1/40]
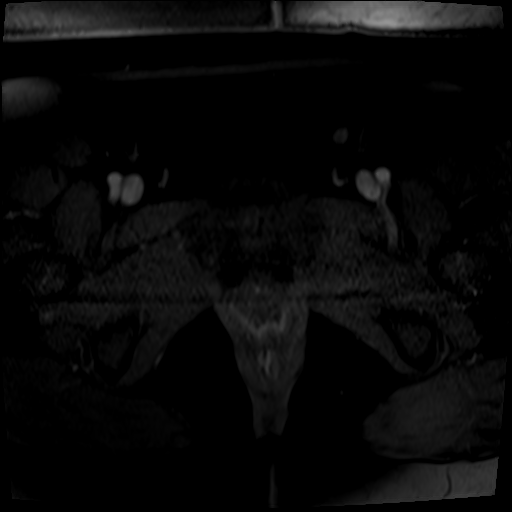
[im 20/40]
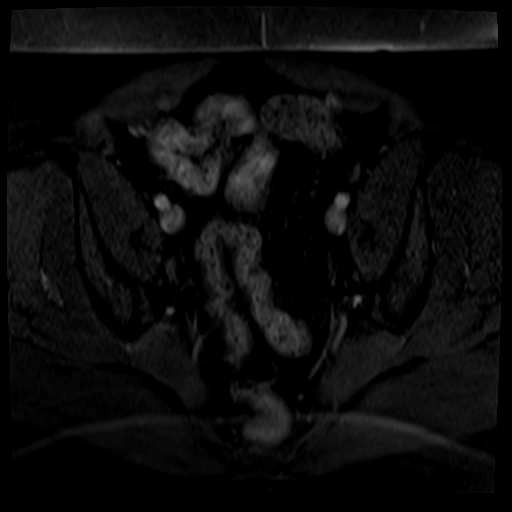
[im 40/40]
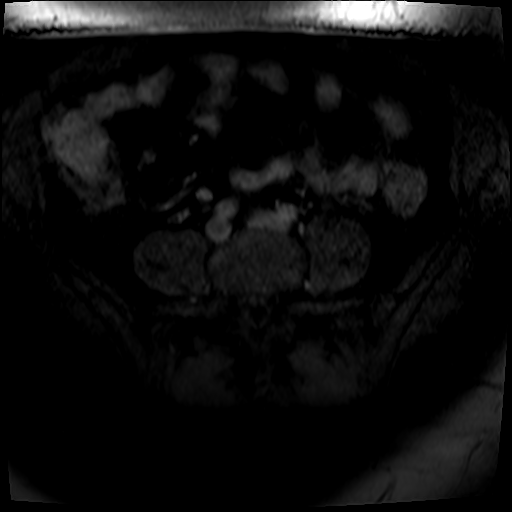

[Series 16: T1 dynamic · axial · 4.0mm · 0.49mm/px · z∈[-14,+142]mm · 3 of 40 slices shown (2 of 3)]
[im 1/40]
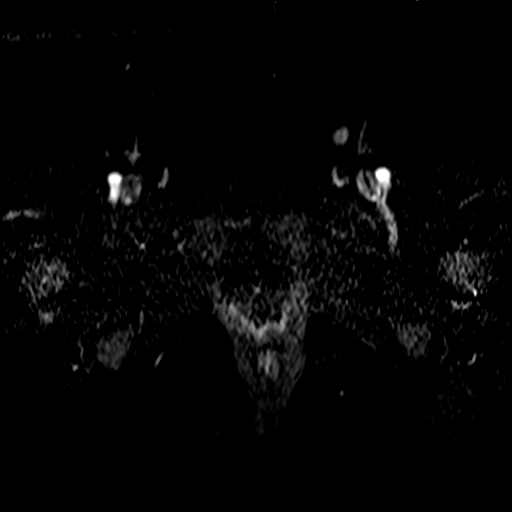
[im 20/40]
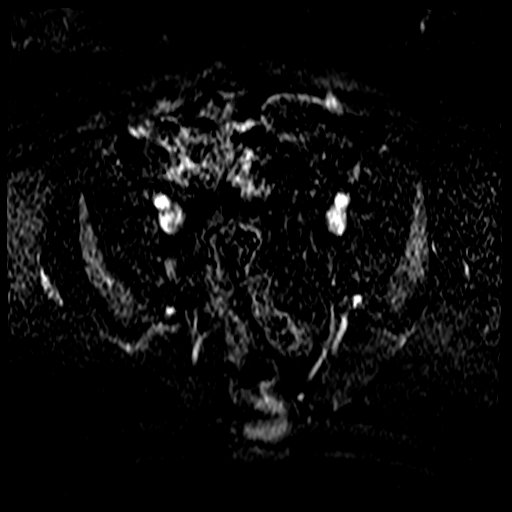
[im 40/40]
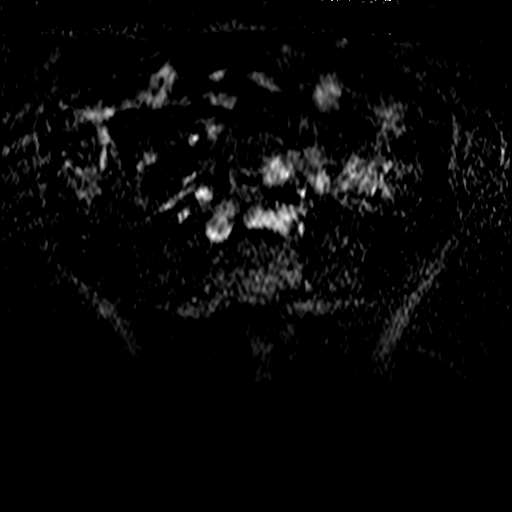

[Series 17: T1 dynamic post-contrast · axial · 4.0mm · 0.49mm/px · z∈[-14,+142]mm · 3 of 40 slices shown (2 of 2)]
[im 1/40]
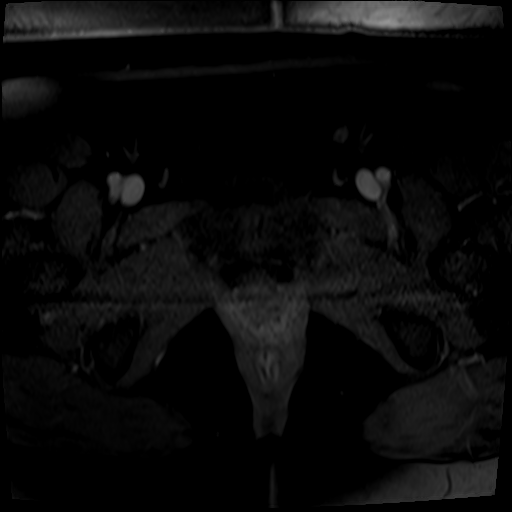
[im 20/40]
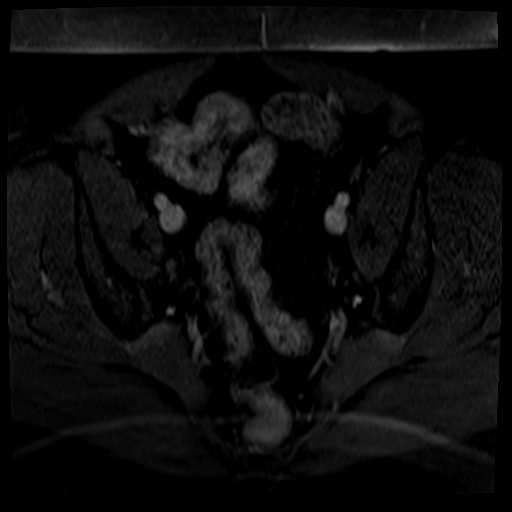
[im 40/40]
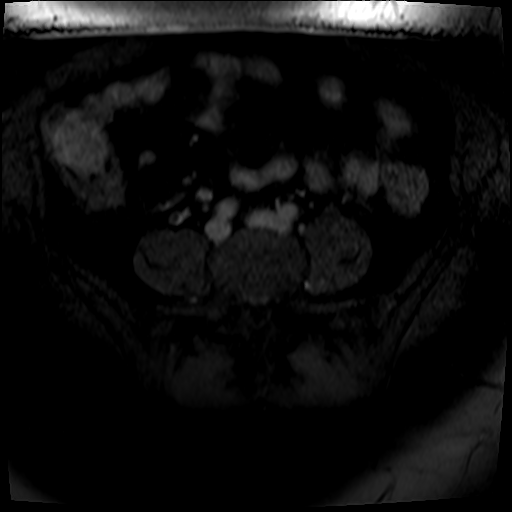

[Series 18: T1 dynamic · axial · 4.0mm · 0.49mm/px · 1 of 40 slices shown (3 of 3)]
[im 1/40]
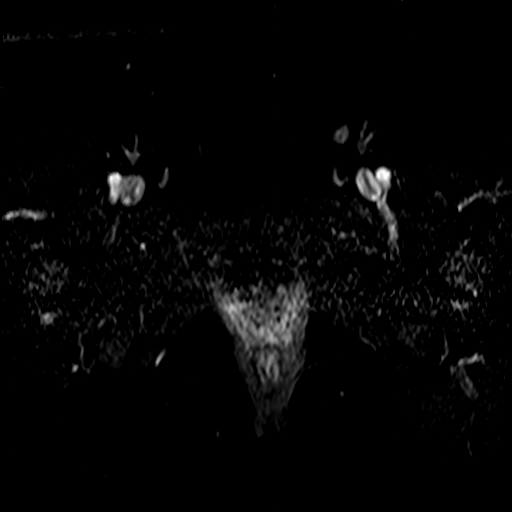

[30 of 48 positions shown; findings below may reference images not displayed]

FINDINGS: Urinary Tract:  No abnormality visualized.

Bowel:  Unremarkable visualized pelvic bowel loops.

Vascular/Lymphatic: No pathologically enlarged lymph nodes. No
significant vascular abnormality seen.

Reproductive: Status post hysterectomy. The right ovary is in a
relatively high lying position in the pelvis and contains multiple
small cysts of varying T1 and T2 signal, generally consistent with
hemorrhagic or proteinaceous cysts and follicles. There is no
suspicious contrast enhancement. The left ovary is not distinctly
identified and may be surgically absent.

Other:  None.

Musculoskeletal: No suspicious bone lesions identified.
IMPRESSION: 1. Status post hysterectomy.
2. The right ovary is in a relatively high lying position in the
pelvis and contains multiple small cysts of varying T1 and T2
signal, generally consistent with hemorrhagic or proteinaceous cysts
and follicles. There is no suspicious contrast enhancement course or
solid-appearing mass.
3. The left ovary is not distinctly identified and may be surgically
absent.

## 2022-04-01 DIAGNOSIS — R319 Hematuria, unspecified: Secondary | ICD-10-CM | POA: Insufficient documentation

## 2022-04-29 ENCOUNTER — Encounter (HOSPITAL_BASED_OUTPATIENT_CLINIC_OR_DEPARTMENT_OTHER): Payer: Self-pay | Admitting: *Deleted

## 2022-04-29 ENCOUNTER — Emergency Department (HOSPITAL_BASED_OUTPATIENT_CLINIC_OR_DEPARTMENT_OTHER): Payer: 59

## 2022-04-29 ENCOUNTER — Emergency Department (HOSPITAL_BASED_OUTPATIENT_CLINIC_OR_DEPARTMENT_OTHER)
Admission: EM | Admit: 2022-04-29 | Discharge: 2022-04-29 | Disposition: A | Discharge status: Home / Self Care | Payer: 59 | Attending: Emergency Medicine | Admitting: Emergency Medicine

## 2022-04-29 ENCOUNTER — Other Ambulatory Visit: Payer: Self-pay

## 2022-04-29 DIAGNOSIS — J069 Acute upper respiratory infection, unspecified: Secondary | ICD-10-CM | POA: Diagnosis not present

## 2022-04-29 DIAGNOSIS — Z7951 Long term (current) use of inhaled steroids: Secondary | ICD-10-CM | POA: Diagnosis not present

## 2022-04-29 DIAGNOSIS — R059 Cough, unspecified: Secondary | ICD-10-CM | POA: Diagnosis present

## 2022-04-29 DIAGNOSIS — J45909 Unspecified asthma, uncomplicated: Secondary | ICD-10-CM | POA: Diagnosis not present

## 2022-04-29 DIAGNOSIS — J029 Acute pharyngitis, unspecified: Secondary | ICD-10-CM | POA: Diagnosis not present

## 2022-04-29 HISTORY — DX: Type 2 diabetes mellitus without complications: E11.9

## 2022-04-29 MED ORDER — DEXAMETHASONE SODIUM PHOSPHATE 10 MG/ML IJ SOLN
10.0000 mg | Freq: Once | INTRAMUSCULAR | Status: AC
  Administered 2022-04-29: 10 mg via INTRAMUSCULAR
  Filled 2022-04-29: qty 1

## 2022-04-29 NOTE — ED Provider Notes (Signed)
[facility] Provider Note   CSN: [id number] Arrival date & time: 04/29/22  1140     History  Chief Complaint  Patient presents with   URI    Christina Parker is a 47 y.o. female.  Patient presents with sore throat, sneezing, cough and feeling generally unwell.  Patient had shortness of breath during the night.  Patient tried her inhaler but no significant improvement.  Patient has asthma history.  No heart failure history of blood clot history, no recent surgeries, no unilateral leg swelling or active cancer treatment.  No weight gain.       Home Medications Prior to Admission medications   Medication Sig Start Date End Date Taking? Authorizing Provider  albuterol (PROVENTIL HFA;VENTOLIN HFA) 108 (90 BASE) MCG/ACT inhaler Inhale 2 puffs into the lungs every 4 (four) hours as needed for wheezing or shortness of breath (cough, shortness of breath or wheezing.). 12/07/13   Roselee Culver, MD  Ascorbic Acid (VITAMIN C) 1000 MG tablet Take 1,000 mg by mouth daily.    [provider]  cetirizine (ZYRTEC) 10 MG tablet Take 10 mg by mouth daily.    [provider]  fluticasone (FLONASE) 50 MCG/ACT nasal spray Place 2 sprays into both nostrils daily. 02/18/14   Chesley Mires, MD  ondansetron (ZOFRAN-ODT) 4 MG disintegrating tablet Take 1 tablet (4 mg total) by mouth every 8 (eight) hours as needed for nausea or vomiting. 07/17/21   Jackquline Denmark, MD  pantoprazole (PROTONIX) 40 MG tablet TAKE 1 TABLET (40 MG TOTAL) BY MOUTH DAILY. USE AS NEEDED FOR GERD 02/06/20   Copland, Gay Filler, MD  pregabalin (LYRICA) 75 MG capsule Take 75 mg by mouth 2 (two) times daily as needed. 06/01/21   [provider]      Allergies    Patient has no known allergies.    Review of Systems   Review of Systems  Constitutional:  Negative for chills and fever.  HENT:  Positive for congestion and sore throat.   Eyes:  Negative for visual  disturbance.  Respiratory:  Positive for cough and shortness of breath.   Cardiovascular:  Negative for chest pain.  Gastrointestinal:  Negative for abdominal pain and vomiting.  Genitourinary:  Negative for dysuria and flank pain.  Musculoskeletal:  Negative for back pain, neck pain and neck stiffness.  Skin:  Negative for rash.  Neurological:  Negative for light-headedness and headaches.    Physical Exam Updated Vital Signs BP 127/84   Pulse 87   Temp 98.2 F (36.8 C) (Oral)   Resp (!) 26   Wt 103 kg   LMP 07/08/2013   SpO2 98%   BMI 35.56 kg/m  Physical Exam Vitals and nursing note reviewed.  Constitutional:      General: She is not in acute distress.    Appearance: She is well-developed.  HENT:     Head: Normocephalic and atraumatic.     Nose: Congestion present.     Mouth/Throat:     Mouth: Mucous membranes are moist.     Pharynx: No oropharyngeal exudate or posterior oropharyngeal erythema.  Eyes:     General:        Right eye: No discharge.        Left eye: No discharge.     Conjunctiva/sclera: Conjunctivae normal.  Neck:     Trachea: No tracheal deviation.  Cardiovascular:     Rate and Rhythm: Normal rate and regular rhythm.  Heart sounds: No murmur heard. Pulmonary:     Effort: Pulmonary effort is normal.     Breath sounds: Normal breath sounds.  Abdominal:     General: There is no distension.     Palpations: Abdomen is soft.     Tenderness: There is no abdominal tenderness. There is no guarding.  Musculoskeletal:     Cervical back: Normal range of motion and neck supple. No rigidity.  Skin:    General: Skin is warm.     Capillary Refill: Capillary refill takes less than 2 seconds.     Findings: No rash.  Neurological:     General: No focal deficit present.     Mental Status: She is alert.     Cranial Nerves: No cranial nerve deficit.  Psychiatric:        Mood and Affect: Mood normal.     ED Results / Procedures / Treatments   Labs (all  labs ordered are listed, but only abnormal results are displayed) Labs Reviewed - No data to display  EKG None  Radiology DG Chest 2 View  Result Date: 04/29/2022 CLINICAL DATA:  Shortness of breath EXAM: CHEST - 2 VIEW COMPARISON:  05/01/2020 FINDINGS: The heart size and mediastinal contours are within normal limits. Minimal linear atelectasis in the left lower lobe. Lungs are otherwise clear. No pleural effusion or pneumothorax. The visualized skeletal structures are unremarkable. IMPRESSION: Minimal linear atelectasis in the left lower lobe. Lungs otherwise clear. Electronically Signed   By: Davina Poke D.O.   On: 04/29/2022 12:32    Procedures Procedures    Medications Ordered in ED Medications  dexamethasone (DECADRON) injection 10 mg (10 mg Intramuscular Given 04/29/22 1315)    ED Course/ Medical Decision Making/ A&P                             Medical Decision Making Amount and/or Complexity of Data Reviewed Radiology: ordered.  Risk Prescription drug management.   Patient with asthma history presents with clinical concern for viral respiratory infection/pharyngitis, no signs of serious bacterial infection.  Viral testing and strep testing reviewed independently and negative.  No signs of fluid overload on chest x-ray or clinical exam.  Chest x-ray reviewed independently no infiltrate.  Steroids given for component of asthma/allergies.  Discussed continued outpatient follow-up and further testing may be needed if worsening signs or symptoms.  Patient comfortable with plan.         Final Clinical Impression(s) / ED Diagnoses Final diagnoses:  Acute upper respiratory infection  Acute pharyngitis, unspecified etiology    Rx / DC Orders ED Discharge Orders     None         Elnora Morrison, MD 04/29/22 1434

## 2022-04-29 NOTE — ED Triage Notes (Signed)
Pt has been sick with URI and was seen at PCP and dx with laryngitis. Pt states that she was feeling unwell at home and reports sob during the night and wanted to be reevaluated.

## 2022-04-29 NOTE — Discharge Instructions (Addendum)
Return if you have passed out, chest pain, worsening shortness of breath or new concerns.  The steroid dose will last 2 days.  You can use albuterol every 4 hours as needed for wheezing.

## 2022-05-10 ENCOUNTER — Encounter (HOSPITAL_COMMUNITY): Payer: Self-pay

## 2022-07-02 DIAGNOSIS — K760 Fatty (change of) liver, not elsewhere classified: Secondary | ICD-10-CM | POA: Insufficient documentation

## 2022-10-13 LAB — LAB REPORT - SCANNED
A1c: 7.9
EGFR: 108

## 2022-10-22 ENCOUNTER — Telehealth: Payer: Self-pay

## 2022-10-22 NOTE — Telephone Encounter (Signed)
Office received an outside referral from [facility] (Dr. Deno Etienne). Records were reviewed by Dr. Pricilla Holm. Recommendation is for evaluation by a gynecologist for further workup. Patient can see a gyn at [place] or we can give recommendation for gyn offices in [place].   Message relayed to Nadeen Landau at [facility] (phone# [phone]) along with a gyn office in [place] as recommended by Dr. Pricilla Holm [facility] in [place]) Marcelino Duster states she will pass the message on to Dr.Chu.

## 2022-11-10 ENCOUNTER — Encounter: Payer: Self-pay | Admitting: Urology

## 2022-11-10 ENCOUNTER — Ambulatory Visit: Payer: 59 | Admitting: Urology

## 2022-11-10 VITALS — BP 164/98 | HR 114 | Ht 66.0 in | Wt 230.0 lb

## 2022-11-10 DIAGNOSIS — R310 Gross hematuria: Secondary | ICD-10-CM

## 2022-11-10 DIAGNOSIS — R102 Pelvic and perineal pain: Secondary | ICD-10-CM | POA: Diagnosis not present

## 2022-11-10 DIAGNOSIS — R3989 Other symptoms and signs involving the genitourinary system: Secondary | ICD-10-CM

## 2022-11-10 LAB — URINALYSIS, ROUTINE W REFLEX MICROSCOPIC
Bilirubin, UA: NEGATIVE
Glucose, UA: NEGATIVE
Ketones, UA: NEGATIVE
Leukocytes,UA: NEGATIVE
Nitrite, UA: NEGATIVE
Protein,UA: NEGATIVE
Specific Gravity, UA: 1.02 (ref 1.005–1.030)
Urobilinogen, Ur: 0.2 mg/dL (ref 0.2–1.0)
pH, UA: 5.5 (ref 5.0–7.5)

## 2022-11-10 LAB — MICROSCOPIC EXAMINATION

## 2022-11-10 NOTE — Progress Notes (Signed)
Assessment: 1. Bladder pain   2. Gross hematuria   3. Pelvic pain     Plan: I extensively reviewed the patient's chart including provider notes from [facility], [facility], lab results, and imaging results. The patient is adamant that she needs a bladder biopsy performed in order to evaluate her symptoms.  I advised her that I do not feel that a bladder biopsy is indicated since she has had a normal cystoscopy x 2, normal urine test and normal imaging studies.  I advised her that bladder biopsy is typically indicated only if there is a visible abnormality on cystoscopy.  I also advised her that the findings of squamous cells on cytology do not constitute a abnormality, are likely due to vaginal contamination, and that her cytology results have been negative for urothelial abnormalities on multiple tests. I discussed the possibility that her symptoms are due to interstitial cystitis which is a diagnosis of exclusion and a chronic condition for which there is no cure.  Previously, management options for interstitial cystitis have been discussed with the patient based on my chart review.  I recommended that she pursue pelvic floor physical therapy given her chronic pelvic and bladder pain.  I also offered her a trial of antimuscarinic therapy to see if this might improve her bladder symptoms.  She declined any additional medical therapy.  I advised her that I would schedule her for office cystoscopy for further evaluation of her bladder and could perform a pelvic exam at that time.  I also offered repeating the urine cytology with a bladder wash at the time of cystoscopy. The patient was very frustrated and during the entire visit and was insistent that a bladder biopsy needed to be done. The patient left the office without scheduling any follow-up appointments.   I personally spent 45 minutes involved in face to face and non-face-to-face activities for this patient on the day of the visit.   Professional time spent included the following activities, in addition to those noted in the documentation: Extensive review of the patient's chart, and lengthy discussion with the patient regarding her prior urologic evaluation, management options for her symptoms and recommendations for further evaluation.  Chief Complaint:  Chief Complaint  Patient presents with   Hematuria    History of Present Illness:  Christina Parker is a 47 y.o. female who is seen for evaluation of gross hematuria and bladder pain. She has a history of intermittent gross hematuria, pelvic pain and bladder pain for approximately 2 years. She has previously been evaluated for gross hematuria by urology at [facility].  She underwent cystoscopy in December 2023.  Review of her chart shows no bladder abnormalities were identified. Urine cytology from 8/22 was negative for malignancy. Urine cytology from 12/23 was negative for malignancy. Urine cytology from 4/24 was negative for high-grade urothelial carcinoma, benign urothelial cells are present, squamous cell contamination is present. Urine cytology from 10/12/2022 showed atypical squamous cells and benign urothelial cells. She will towards intermittent episodes of dark urine.  She test her urine at home with over-the-counter dipsticks and reports positive blood on the dipstick.  She has a history of lower abdominal pain involving the bladder and pelvic area.  She reports that this is constant in nature.  This is not improved with voiding.  She also has a "constant burning sensation" in the bladder area.  She denies urinary frequency or urgency.  She has had some episodes of incontinence. She has previously been evaluated with  a MRI of the abdomen and pelvis in May 2023 which showed no renal abnormalities other than a Bosniak 1 cyst involving the left kidney. Voiding cystourethrogram from 6/24 showed no bladder or urethral abnormalities. CT abdomen and pelvis from 11/03/2022 done  for evaluation of abdominal pain again showed a simple cyst in the left kidney, no renal or ureteral calculi, no evidence of obstruction and a normal-appearing bladder. She has been evaluated by urogynecology at [facility] with her last visit in 9/24.  Cystoscopy from 6/24 again showed a normal-appearing bladder and urethra.  She was given a trial of hydroxyzine for her bladder symptoms but did not feel like this improved her symptoms.  She was again seen in September 2020 for for evaluation.  She declined cystoscopy as she felt that she needed a biopsy at the same time.  She has been taking Neurontin with only slight improvement in her bladder symptoms.  Treatment for possible interstitial cystitis was discussed with the patient at that time.  Options including pelvic floor physical therapy, medical therapy, and cystoscopy with hydrodistention discussed with the patient.  She is scheduled to undergo pelvic floor physical therapy but reports that is been minimally helpful previously. No recent UTIs.  No history of kidney stones. She is status post a hysterectomy for uterine fibroids and endometriosis.  She is adamant that a bladder biopsy must be performed for further evaluation of her bladder symptoms.  Past Medical History:  Past Medical History:  Diagnosis Date   Anemia    Asthma    exercise induction and seonal allerggies casuse flars   Constipation    Diabetes mellitus without complication (HCC)    Family history of anesthesia complication    pt's daughter has PO N&V   GERD (gastroesophageal reflux disease)    Heart murmur    MVP   History of vitamin B deficiency    History of vitamin D deficiency    MVP (mitral valve prolapse)    Pneumonia    Shortness of breath dyspnea    with abdominal bloating since Jan 2017- "gallbladder"   Vaginal delivery 1997, 2005    Past Surgical History:  Past Surgical History:  Procedure Laterality Date   ABDOMINAL HYSTERECTOMY     May 2021 - removal of  uterus, cervix and one Ovary ( Uterus Mass) , june 22 - revoval of other ovary due to regrowth   CHOLECYSTECTOMY N/A 04/24/2015   Procedure: LAPAROSCOPIC CHOLECYSTECTOMY WITH INTRAOPERATIVE CHOLANGIOGRAM;  Surgeon: Harriette Bouillon, MD;  Location: [facility] OR;  Service: General;  Laterality: N/A;   DILITATION & CURRETTAGE/HYSTROSCOPY WITH NOVASURE ABLATION N/A 08/02/2013   Procedure: DILATATION & CURETTAGE/HYSTEROSCOPY WITH NOVASURE ABLATION;  Surgeon: Zelphia Cairo, MD;  Location: [facility] ORS;  Service: Gynecology;  Laterality: N/A;   TUBAL LIGATION  2005    Allergies:  No Known Allergies  Family History:  Family History  Problem Relation Age of Onset   Allergies Mother    Diabetes Sister    Diabetes Maternal Grandmother    Hypertension Maternal Grandmother    Hypertension Maternal Grandfather    Diabetes Paternal Grandmother    Hypertension Paternal Grandmother    Diabetes Paternal Grandfather    Hypertension Paternal Grandfather    Breast cancer Daughter 45   Asthma Son    Allergies Son    Stomach cancer Neg Hx    Colon cancer Neg Hx    Rectal cancer Neg Hx     Social History:  Social History   Tobacco Use  Smoking status: Never   Smokeless tobacco: Never   Tobacco comments:    Exposed x 11 years. Pt works in home health  Vaping Use   Vaping status: Never Used  Substance Use Topics   Alcohol use: No    Alcohol/week: 0.0 standard drinks of alcohol   Drug use: No    Review of symptoms:  Constitutional:  Negative for unexplained weight loss, night sweats, fever, chills ENT:  Negative for nose bleeds, sinus pain, painful swallowing CV:  Negative for chest pain, shortness of breath, exercise intolerance, palpitations, loss of consciousness Resp:  Negative for cough, wheezing, shortness of breath GI:  Negative for nausea, vomiting, diarrhea, bloody stools GU:  Positives noted in HPI; otherwise negative Neuro:  Negative for seizures, poor balance, limb weakness, slurred  speech Psych:  Negative for lack of energy, depression, anxiety Endocrine:  Negative for polydipsia, polyuria, symptoms of hypoglycemia (dizziness, hunger, sweating) Hematologic:  Negative for anemia, purpura, petechia, prolonged or excessive bleeding, use of anticoagulants  Allergic:  Negative for difficulty breathing or choking as a result of exposure to anything; no shellfish allergy; no allergic response (rash/itch) to materials, foods  Physical exam: BP (!) 164/98   Pulse (!) 114   Ht 5\' 6"  (1.676 m)   Wt 230 lb (104.3 kg)   LMP 07/08/2013   BMI 37.12 kg/m  GENERAL APPEARANCE:  Well appearing, well developed, well nourished, NAD HEENT:  Atraumatic, normocephalic, oropharynx clear NECK:  Supple  EXTREMITIES:  Moves all extremities well, without clubbing, cyanosis, or edema NEUROLOGIC:  Alert and oriented x 3, normal gait, CN II-XII grossly intact MENTAL STATUS:  appropriate GU:  deferred  Results: U/A: 0-2 RBC, 0 WBC

## 2022-11-15 ENCOUNTER — Encounter: Payer: 59 | Admitting: Urology

## 2022-11-25 ENCOUNTER — Other Ambulatory Visit: Payer: Self-pay | Admitting: Urology

## 2022-11-25 ENCOUNTER — Encounter: Payer: Self-pay | Admitting: Urology

## 2022-11-25 DIAGNOSIS — R102 Pelvic and perineal pain: Secondary | ICD-10-CM

## 2022-11-26 ENCOUNTER — Telehealth (HOSPITAL_BASED_OUTPATIENT_CLINIC_OR_DEPARTMENT_OTHER): Payer: Self-pay

## 2022-11-26 ENCOUNTER — Other Ambulatory Visit: Payer: Self-pay | Admitting: Urology

## 2022-11-26 DIAGNOSIS — R102 Pelvic and perineal pain: Secondary | ICD-10-CM

## 2022-12-02 ENCOUNTER — Telehealth (HOSPITAL_BASED_OUTPATIENT_CLINIC_OR_DEPARTMENT_OTHER): Payer: Self-pay

## 2022-12-06 ENCOUNTER — Ambulatory Visit (HOSPITAL_BASED_OUTPATIENT_CLINIC_OR_DEPARTMENT_OTHER)
Admission: RE | Admit: 2022-12-06 | Discharge: 2022-12-06 | Disposition: A | Discharge status: Home / Self Care | Payer: 59 | Source: Ambulatory Visit | Attending: Urology | Admitting: Urology

## 2022-12-06 DIAGNOSIS — R102 Pelvic and perineal pain: Secondary | ICD-10-CM | POA: Insufficient documentation

## 2022-12-15 ENCOUNTER — Encounter: Payer: Self-pay | Admitting: Urology

## 2022-12-22 ENCOUNTER — Encounter: Payer: 59 | Admitting: Obstetrics & Gynecology

## 2023-01-24 ENCOUNTER — Inpatient Hospital Stay (HOSPITAL_BASED_OUTPATIENT_CLINIC_OR_DEPARTMENT_OTHER): Payer: 59 | Admitting: Medical Oncology

## 2023-01-24 ENCOUNTER — Encounter: Payer: Self-pay | Admitting: Medical Oncology

## 2023-01-24 ENCOUNTER — Inpatient Hospital Stay: Payer: 59

## 2023-01-24 VITALS — BP 137/87 | HR 111 | Temp 99.0°F | Resp 18 | Ht 66.0 in | Wt 228.1 lb

## 2023-01-24 DIAGNOSIS — R7989 Other specified abnormal findings of blood chemistry: Secondary | ICD-10-CM

## 2023-01-24 DIAGNOSIS — L4050 Arthropathic psoriasis, unspecified: Secondary | ICD-10-CM | POA: Diagnosis not present

## 2023-01-24 DIAGNOSIS — Z79899 Other long term (current) drug therapy: Secondary | ICD-10-CM | POA: Diagnosis not present

## 2023-01-24 DIAGNOSIS — R896 Abnormal cytological findings in specimens from other organs, systems and tissues: Secondary | ICD-10-CM

## 2023-01-24 NOTE — Progress Notes (Signed)
[facility] [phone]   [phone]  INITIAL CONSULT NOTE  Patient Care Team: Renaye Rakers, MD as PCP - General (Family Medicine)  CHIEF COMPLAINTS/PURPOSE OF CONSULTATION:  Elevated ferritin and low iron saturation rate  HISTORY OF PRESENTING ILLNESS:  Christina Parker 47 y.o. female is referred to our office by their PCP from [facility] for abnormal work up for potential anemia.  Labs sent with her referral from 11/03/2022 show a ferritin of 174 and an iron saturation of 14%. Last Hgb was 13.4.   We reviewed her history. She discussed concerns about hematuria that she has had. She has been followed closely by urology and nephrology for this including and has had many cystoscopies. According to patient they have been normal. She also has been evaluated and treated by rheumatology in the past. She reports psoriatic arthritis and RA. Previously on [place] which she states helped her symptoms but reports that she is now off of this medication.   She states that she "wants a whole new set of doctors in 2025" and that she wants to find out "what is wrong with me". She request "all tumor markers" to be ran today along with "any labs that can tell me what is wrong". She is struggling with various concerns including abdominal pain, dysuria, hematuria, fatigue.   Blood in stools: No Change of bowel movement/constipation: No Family history of GI cancers: No Last Colonoscopy:10/2022- one benign polyp  Last Endoscopy: No History of GERD or GI bleed: Yes but well controlled  UC/Crohn's: No PPI use: Protonix PRN - has not used it in about 2 months.  Hiatal Hernia: No NSAID use: Yes- Celebrex which she takes daily.  Blood in urine: No- negative microscopic on 11/10/2022 Difficulty swallowing: No Pica: No SOB: With exercise on occasion Fatigue: Yes Prior blood transfusion: No Prior history of blood loss: No Liver disease: Yes- fatty liver/Liver  lesion.  Kidney Disease: No Bariatric/Intestinal surgery: No Frequent Blood Donation: No Vaginal bleeding: No recently  Prior evaluation with hematology: No Prior bone marrow biopsy: No Oral iron: No Prior IV iron infusions: No Family history Anemia: No known sickle cell or thalassemia  No Personal or family history of bleeding or clotting disorders.  Special diets: No No use of GLP-1: Ozempic - has been on it for 5 weeks Iron rich foods in diet: She eats some  Eating habits: She eats about 2 meals and a snack per day.   Wt Readings from Last 3 Encounters:  01/24/23 228 lb 1.3 oz (103.5 kg)  11/10/22 230 lb (104.3 kg)  04/29/22 227 lb 1.2 oz (103 kg)     MEDICAL HISTORY:  Past Medical History:  Diagnosis Date   Anemia    Asthma    exercise induction and seonal allerggies casuse flars   Constipation    Diabetes mellitus without complication (HCC)    Family history of anesthesia complication    pt's daughter has PO N&V   GERD (gastroesophageal reflux disease)    Heart murmur    MVP   History of vitamin B deficiency    History of vitamin D deficiency    MVP (mitral valve prolapse)    Pneumonia    Shortness of breath dyspnea    with abdominal bloating since Jan 2017- "gallbladder"   Vaginal delivery 1997, 2005    SURGICAL HISTORY: Past Surgical History:  Procedure Laterality Date   ABDOMINAL HYSTERECTOMY     May 2021 - removal of uterus,  cervix and one Ovary ( Uterus Mass) , june 22 - revoval of other ovary due to regrowth   CHOLECYSTECTOMY N/A 04/24/2015   Procedure: LAPAROSCOPIC CHOLECYSTECTOMY WITH INTRAOPERATIVE CHOLANGIOGRAM;  Surgeon: Harriette Bouillon, MD;  Location: [facility] OR;  Service: General;  Laterality: N/A;   DILITATION & CURRETTAGE/HYSTROSCOPY WITH NOVASURE ABLATION N/A 08/02/2013   Procedure: DILATATION & CURETTAGE/HYSTEROSCOPY WITH NOVASURE ABLATION;  Surgeon: Zelphia Cairo, MD;  Location: [facility] ORS;  Service: Gynecology;  Laterality: N/A;   TUBAL LIGATION   2005    SOCIAL HISTORY: Social History   Socioeconomic History   Marital status: Married    Spouse name: Not on file   Number of children: 2   Years of education: Not on file   Highest education level: Not on file  Occupational History   Occupation: Charity fundraiser, Home Health  Tobacco Use   Smoking status: Never   Smokeless tobacco: Never   Tobacco comments:    Exposed x 11 years. Pt works in home health  Vaping Use   Vaping status: Never Used  Substance and Sexual Activity   Alcohol use: No    Alcohol/week: 0.0 standard drinks of alcohol   Drug use: No   Sexual activity: Not on file  Other Topics Concern   Not on file  Social History Narrative   Married   Psychologist, forensic   Social Drivers of Health   Financial Resource Strain: Low Risk  (12/08/2021)   Received [facility]   Overall Financial Resource Strain (CARDIA)    Difficulty of Paying Living Expenses: Not hard at all  Food Insecurity: No Food Insecurity (01/24/2023)   Hunger Vital Sign    Worried About Running Out of Food in the Last Year: Never true    Ran Out of Food in the Last Year: Never true  Transportation Needs: No Transportation Needs (01/24/2023)   PRAPARE - Administrator, Civil Service (Medical): No    Lack of Transportation (Non-Medical): No  Physical Activity: Not on file  Stress: No Stress Concern Present (12/08/2021)   Received from [facility], [facility]   [facility] - Occupational Stress Questionnaire    Feeling of Stress : Not at all  Social Connections: Unknown (06/07/2022)   Received from [facility], [facility]   Social Network    Social Network: Not on file  Intimate Partner Violence: Not At Risk (01/24/2023)   Humiliation, Afraid, Rape, and Kick questionnaire    Fear of Current or Ex-Partner: No    Emotionally Abused: No    Physically Abused: No    Sexually Abused: No    FAMILY  HISTORY: Family History  Problem Relation Age of Onset   Allergies Mother    Diabetes Sister    Diabetes Maternal Grandmother    Hypertension Maternal Grandmother    Hypertension Maternal Grandfather    Diabetes Paternal Grandmother    Hypertension Paternal Grandmother    Diabetes Paternal Grandfather    Hypertension Paternal Grandfather    Breast cancer Daughter 79   Asthma Son    Allergies Son    Stomach cancer Neg Hx    Colon cancer Neg Hx    Rectal cancer Neg Hx     ALLERGIES:  has no known allergies.  MEDICATIONS:  Current Outpatient Medications  Medication Sig Dispense Refill   albuterol (PROVENTIL HFA;VENTOLIN HFA) 108 (90 BASE) MCG/ACT inhaler Inhale 2 puffs into the  lungs every 4 (four) hours as needed for wheezing or shortness of breath (cough, shortness of breath or wheezing.). 1 Inhaler 1   celecoxib (CELEBREX) 200 MG capsule Take 200 mg by mouth daily.     cetirizine (ZYRTEC) 10 MG tablet Take 10 mg by mouth daily.     Cholecalciferol (VITAMIN D3) 1.25 MG (50000 UT) CAPS Take 1 capsule by mouth once a week.     CRANBERRY CONCENTRATE PO Take 30,000 Units by mouth daily.     fluticasone (FLONASE) 50 MCG/ACT nasal spray Place 2 sprays into both nostrils daily. 16 g 2   gabapentin (NEURONTIN) 100 MG capsule Take 100 mg by mouth 3 (three) times daily.     hydrOXYzine (VISTARIL) 25 MG capsule Take 25 mg by mouth daily as needed.     JARDIANCE 25 MG TABS tablet Take 25 mg by mouth daily.     Melatonin 10 MG TABS Take 10 mg by mouth at bedtime as needed.     ondansetron (ZOFRAN-ODT) 4 MG disintegrating tablet Take 1 tablet (4 mg total) by mouth every 8 (eight) hours as needed for nausea or vomiting. 30 tablet 2   pantoprazole (PROTONIX) 40 MG tablet TAKE 1 TABLET (40 MG TOTAL) BY MOUTH DAILY. USE AS NEEDED FOR GERD 90 tablet 3   predniSONE (STERAPRED UNI-PAK 21 TAB) 10 MG (21) TBPK tablet Take by mouth as directed.     Semaglutide,0.25 or 0.5MG /DOS, (OZEMPIC, 0.25 OR 0.5  MG/DOSE,) 2 MG/3ML SOPN Inject 0.5 mg into the skin once a week. Injects on Wednesdays     potassium chloride SA (KLOR-CON M) 20 MEQ tablet Take 20 mEq by mouth daily. (Patient not taking: Reported on 01/24/2023)     No current facility-administered medications for this visit.    REVIEW OF SYSTEMS:   Constitutional: ( - ) fevers, ( - )  chills , ( - ) night sweats Eyes: ( - ) blurriness of vision, ( - ) double vision, ( - ) watery eyes Ears, nose, mouth, throat, and face: ( - ) mucositis, ( - ) sore throat Respiratory: ( - ) cough, ( - ) dyspnea, ( - ) wheezes Cardiovascular: ( - ) palpitation, ( - ) chest discomfort, ( - ) lower extremity swelling Gastrointestinal:  ( - ) nausea, ( - ) heartburn, ( - ) change in bowel habits Skin: ( - ) abnormal skin rashes Lymphatics: ( - ) new lymphadenopathy, ( - ) easy bruising Neurological: ( - ) numbness, ( - ) tingling, ( - ) new weaknesses Behavioral/Psych: ( - ) mood change, ( - ) new changes  All other systems were reviewed with the patient and are negative.  PHYSICAL EXAMINATION: ECOG PERFORMANCE STATUS: 1 - Symptomatic but completely ambulatory  Vitals:   01/24/23 1306  BP: 137/87  Pulse: (!) 111  Resp: 18  Temp: 99 F (37.2 C)  SpO2: 97%   Filed Weights   01/24/23 1306  Weight: 228 lb 1.3 oz (103.5 kg)    GENERAL: well appearing female in NAD  SKIN: skin color, texture, turgor are normal, no rashes or significant lesions EYES: conjunctiva are pink and non-injected, sclera clear OROPHARYNX: no exudate, no erythema; lips, buccal mucosa, and tongue normal  NECK: supple appearing  LYMPH:  no palpable lymphadenopathy in the cervical, axillary or supraclavicular lymph nodes.  LUNGS: clear to auscultation and percussion with normal breathing effort HEART: regular rate & rhythm and no murmurs and no lower extremity edema ABDOMEN: soft, no significant  tenderness palpable, non-distended Musculoskeletal: no cyanosis of digits and no  clubbing  PSYCH: alert & oriented x 3, fluent speech. Pressured speech  NEURO: no focal motor/sensory deficits  LABORATORY DATA:  Pending   ASSESSMENT & PLAN Christina Parker is a 47 y.o. female who was referred to [place] from her PCP for an elevated ferritin level.  Initially patient and I discussed that her referral was for iron deficiency.  We discussed that her ferritin level was scantly high and that her iron saturation was scantly low by 1%.  We discussed that typically an elevated ferritin level with a low iron saturation rate traditionally is indicative of some inflammatory process that is causing a falsely elevated ferritin level.Likely secondary to her untreated RA/psoriatic arthritis.  I discussed that I would recommend that she follow back up with rheumatology in regards to this but that I was not overly concerned with her iron levels at this time and I recommended conservative management. She seemed frustrated by this which led to tangential pressured explanation of her frustrations with the heath care system. Patient then began listing out many labs which she wanted to have drawn "while she was here".  I tried to explain to the patient that we were going to try to focus her visit on what she was referred to [place] as at the time I did not see anything in her referral labs or history in hand that would indicate that these would need to be obtained. I tried to explain that I understood her apprehension to having been seen at a cancer center but that at that time I did not see any other concern regards to potential malignancies based on the referral placed. We also looked at her most recent labs and recent CT which did not show concern for masses.  We also discussed the risks involved along with potential false positives in terms of tumor marker testing without a cause.  We also discussed potential insurance complications.  Patient became even more frustrated. To try to reduce the tension and explain my  medical point of view, we then spent a significant amount of time going through records both within the [facility] system as well as [facility], [facility] and [facility].  It is at this point that she realized that her [facility] records were not within our system.  Fortunately her husband was able to obtain these records from a file in her car while we were discussing her case.  These records shed a light on to patient's concerns further.  According to patient and her records that I now have in hand, she has had multiple biopsies, reports, procedures that contradicted themselves in terms of results.  She also was diagnosed on a visit to [facility] on 07/16/2020 with ovarian cancer.  Fortunately this turned out to be a mistake according to [facility] and patient.  Patient reports that given her family history and being told that she did have ovarian cancer that she is having a difficult time excepting the fact that she does not especially with her recent urine cytology report from 10/12/2022 which did show atypical squamous cells of potential gynecological origin.  She also reports being under the impression that her most recent CT of the abdomen from [facility] showed a liver mass.  She voices her concerns that there is something seriously wrong, such as a cancer, that is being missed.  Initially our visit got off to a rough start due to miscommunication, and visit expectations. There are multiple specialists  involved and multiple health systems, some of which are not accessible within the [facility] system. Later once patient divulged her full medical history it is much more understandable why she was as anxious about her visit today and why she is requesting the labs that she would like to have. She reports being hesitant to sharing all of this information initially due to fears of being brushed off. After hearing her experiences I completely understand why she felt this way; I also discussed the importance of knowing this history so that I could best  understand her requests.   I understand now that she is hesitant to see rheumatology as they have recommended Humira which would not be clinically appropriate should she have some type of malignancy.  She did not see much value in seeing them without feeling comfortable being on medication to treat her RA/Psoriatic arthritis.   We did spend a significant amount of our visit going over lab results that I was able to see within the epic system. I was able to verify that [facility] CT [facility] from [facility]03/2022 did not show any masses of concern.  We did discuss that it showed some hepatic steatosis.  We discussed what this was and that I would recommend that her primary care continue monitoring her for this.    In terms of her abnormal urine cytology I have recommended that she continue to seek care through urology.  She reports that she might want to consider a second opinion as she does not feel that recurrent cystoscopies would be in her best interest at this time.  Unfortunately I do not have much to offer her as far as advisement for this other than to seek a second opinion if she does not feel comfortable with her current plan of action.  We did have a long discussion about her iron saturation rates and ferritin levels.  I tried to discussed with patient that I suspect her ferritin level will likely always be slightly elevated especially since she is not on any active treatment for her RA/psoriatic arthritis.  I did discuss with patient that I was willing to complete the labs she has requested given this full history.  I also did discuss with patient that I was sorry that she has been through all she has and that she may benefit from counseling related to the frustrations involved.  She appeared to have a difficult time excepting normal lab results and imaging results that we discussed today however I can very well understand her apprehension given that she has had many results that are vastly different  from each other.   At this time we will have her follow-up in 3 months or sooner based on labs.  We discussed that our main focus here at hematology will be on her iron deficiency unless her tumor markers are elevated.  Again I stressed the importance of her continuing her follow-ups with her primary care and other specialists.  All questions were answered. The patient knows to call the clinic with any problems, questions or concerns.  I have spent a total of 80 minutes minutes of face-to-face and non-face-to-face time, preparing to see the patient, obtaining and/or reviewing separately obtained history, performing a medically appropriate examination, counseling and educating the patient, ordering medications/tests/procedures, referring and communicating with other health care professionals, documenting clinical information in the electronic health record, independently interpreting results and communicating results to the patient, and care coordination.    Clent Jacks PA-C Department of  Hematology/Oncology [facility] at [facility]

## 2023-01-25 ENCOUNTER — Inpatient Hospital Stay: Payer: 59 | Attending: Medical Oncology

## 2023-01-25 DIAGNOSIS — R7989 Other specified abnormal findings of blood chemistry: Secondary | ICD-10-CM | POA: Diagnosis not present

## 2023-01-25 DIAGNOSIS — R896 Abnormal cytological findings in specimens from other organs, systems and tissues: Secondary | ICD-10-CM

## 2023-01-25 LAB — LAB REPORT - SCANNED
Calcium: 9.7
EGFR: 96

## 2023-01-25 LAB — CEA (ACCESS): CEA (CHCC): 1.49 ng/mL (ref 0.00–5.00)

## 2023-02-21 ENCOUNTER — Telehealth: Payer: Self-pay

## 2023-02-21 NOTE — Telephone Encounter (Signed)
Received phone call from patient asking if Clent Jacks, [place] would order a MRI of abdomen/pelvis with and without contrast. Pt stated that she wants to be seen by MD Dareen Piano and they want that imaging before they will see her.  Reviewed pt request with Clent Jacks, PA who stated she would like patient to have urology place the order as we follow her for anemia. Pt aware and had no further questions.

## 2023-03-03 ENCOUNTER — Other Ambulatory Visit: Payer: Self-pay | Admitting: Family Medicine

## 2023-03-03 DIAGNOSIS — Z1231 Encounter for screening mammogram for malignant neoplasm of breast: Secondary | ICD-10-CM

## 2023-03-16 DIAGNOSIS — Z1231 Encounter for screening mammogram for malignant neoplasm of breast: Secondary | ICD-10-CM

## 2023-04-25 ENCOUNTER — Ambulatory Visit: Payer: 59 | Admitting: Medical Oncology

## 2023-04-25 ENCOUNTER — Inpatient Hospital Stay: Payer: Self-pay

## 2023-06-08 ENCOUNTER — Telehealth: Payer: Self-pay

## 2023-06-08 NOTE — Telephone Encounter (Signed)
 Telephoned patient at mobile number. Patient completed BCCCP screening process and is ineligible based upon federal poverty guidelines.

## 2023-06-17 ENCOUNTER — Ambulatory Visit
Admission: RE | Admit: 2023-06-17 | Discharge: 2023-06-17 | Disposition: A | Discharge status: Home / Self Care | Payer: Self-pay | Source: Ambulatory Visit | Attending: Family Medicine | Admitting: Family Medicine

## 2023-06-17 DIAGNOSIS — Z1231 Encounter for screening mammogram for malignant neoplasm of breast: Secondary | ICD-10-CM

## 2023-10-05 ENCOUNTER — Other Ambulatory Visit (HOSPITAL_BASED_OUTPATIENT_CLINIC_OR_DEPARTMENT_OTHER): Payer: Self-pay

## 2023-10-05 DIAGNOSIS — N323 Diverticulum of bladder: Secondary | ICD-10-CM

## 2023-10-05 DIAGNOSIS — R160 Hepatomegaly, not elsewhere classified: Secondary | ICD-10-CM

## 2023-10-17 ENCOUNTER — Ambulatory Visit (HOSPITAL_COMMUNITY): Payer: Self-pay

## 2023-10-17 ENCOUNTER — Encounter (HOSPITAL_COMMUNITY): Payer: Self-pay
